# Patient Record
Sex: Female | Born: 1960 | Race: White | Hispanic: No | Marital: Married | Smoking: Former smoker
Health system: Southern US, Community
[De-identification: ages and names within clinical notes are randomized; demographics above are authoritative.]

## PROBLEM LIST (undated history)

## (undated) DIAGNOSIS — N390 Urinary tract infection, site not specified: Secondary | ICD-10-CM

## (undated) DIAGNOSIS — Z309 Encounter for contraceptive management, unspecified: Secondary | ICD-10-CM

## (undated) DIAGNOSIS — L659 Nonscarring hair loss, unspecified: Secondary | ICD-10-CM

## (undated) DIAGNOSIS — R12 Heartburn: Secondary | ICD-10-CM

## (undated) DIAGNOSIS — I10 Essential (primary) hypertension: Secondary | ICD-10-CM

## (undated) DIAGNOSIS — G5600 Carpal tunnel syndrome, unspecified upper limb: Secondary | ICD-10-CM

## (undated) DIAGNOSIS — M2550 Pain in unspecified joint: Secondary | ICD-10-CM

## (undated) DIAGNOSIS — K829 Disease of gallbladder, unspecified: Secondary | ICD-10-CM

## (undated) HISTORY — DX: Encounter for contraceptive management, unspecified: Z30.9

## (undated) HISTORY — DX: Urinary tract infection, site not specified: N39.0

## (undated) HISTORY — DX: Nonscarring hair loss, unspecified: L65.9

## (undated) HISTORY — DX: Essential (primary) hypertension: I10

## (undated) HISTORY — DX: Disease of gallbladder, unspecified: K82.9

## (undated) HISTORY — PX: CHOLECYSTECTOMY: SHX55

## (undated) HISTORY — PX: OTHER SURGICAL HISTORY: SHX169

## (undated) HISTORY — DX: Heartburn: R12

## (undated) HISTORY — DX: Pain in unspecified joint: M25.50

## (undated) HISTORY — DX: Carpal tunnel syndrome, unspecified upper limb: G56.00

---

## 1998-07-09 ENCOUNTER — Other Ambulatory Visit: Admission: RE | Admit: 1998-07-09 | Discharge: 1998-07-09 | Payer: Self-pay | Admitting: *Deleted

## 1999-09-17 ENCOUNTER — Other Ambulatory Visit: Admission: RE | Admit: 1999-09-17 | Discharge: 1999-09-17 | Payer: Self-pay | Admitting: *Deleted

## 2000-10-11 ENCOUNTER — Other Ambulatory Visit: Admission: RE | Admit: 2000-10-11 | Discharge: 2000-10-11 | Payer: Self-pay | Admitting: *Deleted

## 2001-04-04 ENCOUNTER — Ambulatory Visit (HOSPITAL_COMMUNITY): Admission: RE | Admit: 2001-04-04 | Discharge: 2001-04-04 | Payer: Self-pay | Admitting: *Deleted

## 2001-04-04 ENCOUNTER — Encounter: Payer: Self-pay | Admitting: *Deleted

## 2003-02-02 ENCOUNTER — Emergency Department (HOSPITAL_COMMUNITY): Admission: EM | Admit: 2003-02-02 | Discharge: 2003-02-03 | Payer: Self-pay | Admitting: Emergency Medicine

## 2003-02-03 ENCOUNTER — Inpatient Hospital Stay (HOSPITAL_COMMUNITY): Admission: AD | Admit: 2003-02-03 | Discharge: 2003-02-05 | Payer: Self-pay | Admitting: General Surgery

## 2003-02-03 ENCOUNTER — Encounter: Payer: Self-pay | Admitting: Emergency Medicine

## 2003-02-03 ENCOUNTER — Ambulatory Visit (HOSPITAL_COMMUNITY): Admission: RE | Admit: 2003-02-03 | Discharge: 2003-02-03 | Payer: Self-pay | Admitting: Emergency Medicine

## 2004-01-01 ENCOUNTER — Ambulatory Visit (HOSPITAL_COMMUNITY): Admission: RE | Admit: 2004-01-01 | Discharge: 2004-01-01 | Payer: Self-pay | Admitting: Obstetrics and Gynecology

## 2005-02-01 ENCOUNTER — Ambulatory Visit (HOSPITAL_COMMUNITY): Admission: RE | Admit: 2005-02-01 | Discharge: 2005-02-01 | Payer: Self-pay | Admitting: Obstetrics and Gynecology

## 2006-02-02 ENCOUNTER — Ambulatory Visit (HOSPITAL_COMMUNITY): Admission: RE | Admit: 2006-02-02 | Discharge: 2006-02-02 | Payer: Self-pay | Admitting: Obstetrics and Gynecology

## 2007-02-05 ENCOUNTER — Ambulatory Visit (HOSPITAL_COMMUNITY): Admission: RE | Admit: 2007-02-05 | Discharge: 2007-02-05 | Payer: Self-pay | Admitting: Obstetrics & Gynecology

## 2007-03-12 ENCOUNTER — Other Ambulatory Visit: Admission: RE | Admit: 2007-03-12 | Discharge: 2007-03-12 | Payer: Self-pay | Admitting: Obstetrics and Gynecology

## 2007-12-05 ENCOUNTER — Ambulatory Visit (HOSPITAL_COMMUNITY): Admission: RE | Admit: 2007-12-05 | Discharge: 2007-12-05 | Payer: Self-pay | Admitting: Family Medicine

## 2007-12-10 ENCOUNTER — Ambulatory Visit (HOSPITAL_COMMUNITY): Admission: RE | Admit: 2007-12-10 | Discharge: 2007-12-10 | Payer: Self-pay | Admitting: Family Medicine

## 2008-04-29 ENCOUNTER — Other Ambulatory Visit: Admission: RE | Admit: 2008-04-29 | Discharge: 2008-04-29 | Payer: Self-pay | Admitting: Obstetrics & Gynecology

## 2009-07-08 ENCOUNTER — Other Ambulatory Visit: Admission: RE | Admit: 2009-07-08 | Discharge: 2009-07-08 | Payer: Self-pay | Admitting: Obstetrics and Gynecology

## 2010-05-16 ENCOUNTER — Encounter: Payer: Self-pay | Admitting: Obstetrics and Gynecology

## 2010-09-10 NOTE — Group Therapy Note (Signed)
   Valerie Simon, Valerie Simon                           ACCOUNT NO.:  [id number]   MEDICAL RECORD NO.:  [id number]                   PATIENT TYPE:  INP   LOCATION:  A336                                 FACILITY:  [facility]   PHYSICIAN:  Edward L. Juanetta Gosling, M.D.             DATE OF BIRTH:  May 23, 1960   DATE OF PROCEDURE:  02/04/2003  DATE OF DISCHARGE:                                   PROGRESS NOTE   SUBJECTIVE:  Valerie Simon says she feels much better.  She did not have any pain  last night and she is set for surgery today.   OBJECTIVE:  Her exam shows her temperature is 98, pulse 71, respirations 20,  blood pressure 116/73.  She still has mild right upper quadrant tenderness.   ASSESSMENT:  She seems to be doing a lot better.   PLAN:  Continue treatments.  Await surgery.      ___________________________________________                                            Oneal Deputy Juanetta Gosling, M.D.   ELH/MEDQ  D:  02/04/2003  T:  02/04/2003  Job:  [id number]

## 2010-09-10 NOTE — Discharge Summary (Signed)
Valerie Simon, GOTO                           ACCOUNT NO.:  [id number]   MEDICAL RECORD NO.:  [id number]                   PATIENT TYPE:  INP   LOCATION:  A336                                 FACILITY:  [facility]   PHYSICIAN:  Barbaraann Barthel, M.D.              DATE OF BIRTH:  1960/11/13   DATE OF ADMISSION:  02/03/2003  DATE OF DISCHARGE:  02/05/2003                                 DISCHARGE SUMMARY   DISCHARGE DIAGNOSES:  Cholecystitis and cholelithiasis.   PROCEDURE:  On February 04, 2003, a laparoscopic cholecystectomy.   HISTORY OF PRESENT ILLNESS:  This is a 50 year old white female who was  admitted to the hospital with signs of acute cholecystitis.   HOSPITAL COURSE:  She was admitted on February 03, 2003.  We obtained a  sonogram which showed signs of acute cholecystitis as well as  cholelithiasis.  Liver function tests were grossly within normal limits.  We  admitted her and hydrated her.  We began antibiotics.  She was scheduled for  surgery after the sonogram on the following day on February 04, 2003.  At the  time of surgery she was noted to have an acutely and chronically-inflamed  gallbladder with multiple stones and a thickened gallbladder.  A  laparoscopic cholecystectomy was performed without incident.  The patient  did well.  The pathology revealed chronic active calculus cholecystitis.  The patient's postoperative course was unremarkable.  Her diet and activity  was advanced as tolerated.  Her wounds remained clean without sign of  infection or herniation.  At the time of discharge she was tolerating liquids well.  She was voiding  without dysuria, and she was afebrile.   LABORATORY DATA:  Her white count was 9.8 with an H&H of 13.4 and 39.1.  Her  metabolic-7 was grossly within normal limits.  Postoperatively her H&H was  stable at 11.2 and 33.2.  As stated, her liver function tests were within  normal limits.  The patient did very well.   DISCHARGE  INSTRUCTIONS:  1. She is excused from work.  2. She is permitted a soft diet.  3. She is permitted to shower and to walk up and down the stairs.  4. She is to avoid doing any driving or any heavy lifting or sexual activity     in the immediate postoperative period.  5. We will follow up with her perioperatively.  She is told to clean her     wound with alcohol three times a day.  6. She is instructed to take no aspirin.  7. She is told to contact us if there are any acute symptoms.   DISCHARGE MEDICATIONS:  She is given a prescription for Darvocet-N-100 one  tab q.4h. p.r.n. pain.   FOLLOWUP:  1. We will see her perioperatively.  2. She is to return to Dr. Ramon Dredge L. Juanetta Gosling, should she have any medical  problems in the future.     ___________________________________________                                         Barbaraann Barthel, M.D.   WB/MEDQ  D:  03/24/2003  T:  03/24/2003  Job:  [id number]   cc:   Ramon Dredge L. Juanetta Gosling, M.D.  [address]  [place]  [place] [address]  Fax: [phone]

## 2010-09-10 NOTE — Op Note (Signed)
NAME:  Valerie Simon, Valerie Simon NO.:  [id number]   MEDICAL RECORD NO.:  [id number]                    PATIENT TYPE:   LOCATION:                                       FACILITY:   PHYSICIAN:  Barbaraann Barthel, M.D.              DATE OF BIRTH:   DATE OF PROCEDURE:  02/04/2003  DATE OF DISCHARGE:                                 OPERATIVE REPORT   PREOPERATIVE DIAGNOSIS:  Cholecystitis and cholelithiasis.   POSTOPERATIVE DIAGNOSIS:  Cholecystitis and cholelithiasis.   PROCEDURE:  Laparoscopic cholecystectomy.   SURGEON:  Barbaraann Barthel, M.D.   SPECIMENS:  Gallbladder and stones.   NOTE:  This is a 50 year old white female who had recurrent symptoms of  right upper quadrant pain and nausea.  She was noted on sonogram to have  signs of cholecystitis with cholelithiasis present.  Liver function studies  were within normal limits.   We discussed surgery, in detail, including complications, not limited to,  but including: bleeding, infection, damage to bile ducts, perforation of  organs and transitory diarrhea.  Informed consent was obtained.   GROSS OPERATIVE FINDINGS:  Moderate inflammation, slightly thickened  gallbladder.  The cystic duct was short and of normal caliber.  A  cholangiogram was note performed.  There was an olive sized stone within the  gallbladder towards the fundus.  The right upper quadrant, otherwise,  appeared to be normal.   TECHNIQUE:  The patient was placed in the supine position and after the  adequate administration of general anesthesia by endotracheal intubation her  entire abdomen was prepped with Betadine solution and draped in the usual  manner.  With the patient in Trendelenburg, a periumbilical incision was  carried out over the superior aspect of the umbilicus.  The fascia was  grasped and elevated and a Veress needle was inserted and confirmed in  position with a saline drop test.  We then used the Visiport technique  and  an 11-mm cannula was placed in the umbilicus.  Then, under direct vision 3  other cannulas were placed; an 11-mm cannula in the epigastrium, and two 5-  mm cannulas in right upper quadrant laterally.   The gallbladder was grasped.  Its adhesions were taken down.  The cystic  duct was clearly visualized  triply silver clipped and divided as was the  cystic artery.  The gallbladder was then removed from the liver bed using  the cautery device.  There was minimal oozing.  This was controlled easily  with the cautery device.  The gallbladder was then placed in an EndoCatch  device and removed through the 11-mm cannula site in the epigastrium.  We  then checked for hemostasis, irrigated with normal saline solution and then  desufflated the abdomen.  The fascia sutures in the epigastrium and the  umbilicus incisions were closed with #0 Polysorb, and I infiltrated 1/2%  Sensorcaine, approximately 3 or 4  cc in each port site to assist with  postoperative pain.   The wounds were then irrigated and closed with a stapling device.  Prior to  closure, all sponge, needle, and instrument counts were found to be correct.  Estimated blood loss was minimal.  The patient received 1200 cc of  crystalloids intraoperatively.  No drains were placed.  There were no  complications.      ___________________________________________                                            Barbaraann Barthel, M.D.   WB/MEDQ  D:  02/04/2003  T:  02/04/2003  Job:  [id number]

## 2011-12-05 ENCOUNTER — Other Ambulatory Visit: Payer: Self-pay | Admitting: Obstetrics & Gynecology

## 2011-12-05 ENCOUNTER — Other Ambulatory Visit (HOSPITAL_COMMUNITY)
Admission: RE | Admit: 2011-12-05 | Discharge: 2011-12-05 | Disposition: A | Discharge status: Home / Self Care | Payer: 59 | Source: Ambulatory Visit | Attending: Obstetrics & Gynecology | Admitting: Obstetrics & Gynecology

## 2011-12-05 DIAGNOSIS — Z01419 Encounter for gynecological examination (general) (routine) without abnormal findings: Secondary | ICD-10-CM | POA: Insufficient documentation

## 2012-01-10 ENCOUNTER — Encounter (HOSPITAL_COMMUNITY): Payer: Self-pay | Admitting: *Deleted

## 2012-01-10 ENCOUNTER — Emergency Department (HOSPITAL_COMMUNITY)
Admission: EM | Admit: 2012-01-10 | Discharge: 2012-01-10 | Disposition: A | Discharge status: Home / Self Care | Payer: 59 | Source: Home / Self Care | Attending: Emergency Medicine | Admitting: Emergency Medicine

## 2012-01-10 DIAGNOSIS — N390 Urinary tract infection, site not specified: Secondary | ICD-10-CM

## 2012-01-10 LAB — POCT URINALYSIS DIP (DEVICE)
Bilirubin Urine: NEGATIVE
Glucose, UA: NEGATIVE mg/dL
Ketones, ur: NEGATIVE mg/dL
Nitrite: POSITIVE — AB
pH: 6 (ref 5.0–8.0)

## 2012-01-10 MED ORDER — CEPHALEXIN 500 MG PO CAPS: 500.0000 mg | ORAL_CAPSULE | Freq: Four times a day (QID) | ORAL | Status: DC

## 2012-01-10 NOTE — ED Provider Notes (Signed)
CSN: [id number]  Arrival date & time 01/10/12  1355   First MD Initiated Contact with Patient 01/10/12 1425      Chief Complaint  Patient presents with  . Urinary Tract Infection    (Consider location/radiation/quality/duration/timing/severity/associated sxs/prior treatment) HPI Comments: Patient presents urgent care complaining of pressure or increased frequency and burning with urination. Initially she was having a fall smell to her urine she thought it was related to a lipid-lowering boil that was prescribed to her by her primary care Dr. Patient denies any fevers, flank pain or vomiting or feeling nauseous. Does describe a upper back pain bilaterally. It is exacerbated with rotation of movement. Denies any spontaneous abdominal pain it only hurts when pressure is exerted or when she urinates.  Patient is a 51 y.o. female presenting with urinary tract infection. The history is provided by the patient.  Urinary Tract Infection This is a new problem. The problem occurs constantly. The problem has been gradually worsening. Pertinent negatives include no abdominal pain and no shortness of breath. Nothing aggravates the symptoms. Nothing relieves the symptoms.    History reviewed. No pertinent past medical history.  History reviewed. No pertinent past surgical history.  No family history on file.  History  Substance Use Topics  . Smoking status: Not on file  . Smokeless tobacco: Not on file  . Alcohol Use: Not on file    OB History    Grav Para Term Preterm Abortions TAB SAB Ect Mult Living                  Review of Systems  Constitutional: Negative for chills, activity change and appetite change.  Respiratory: Negative for shortness of breath.   Gastrointestinal: Negative for nausea, vomiting, abdominal pain and diarrhea.  Genitourinary: Positive for dysuria and frequency. Negative for hematuria, flank pain, enuresis, genital sores, vaginal pain, menstrual problem  and pelvic pain.  Skin: Negative for rash.    Allergies  Review of patient's allergies indicates not on file.  Home Medications   Current Outpatient Rx  Name Route Sig Dispense Refill  . LOESTRIN 1/20 (21) PO Oral Take by mouth.    . CEPHALEXIN 500 MG PO CAPS Oral Take 1 capsule (500 mg total) by mouth 4 (four) times daily. 20 capsule 0    BP 169/102  Pulse 92  Temp 98.6 F (37 C) (Oral)  Resp 19  SpO2 100%  LMP 01/03/2012  Physical Exam  Nursing note and vitals reviewed. Constitutional: Vital signs are normal. She appears well-developed and well-nourished.  Non-toxic appearance. She does not have a sickly appearance. She does not appear ill. No distress.  Eyes: Conjunctivae normal are normal.  Neck: Neck supple. No JVD present.  Cardiovascular: Normal rate.  Exam reveals no gallop and no friction rub.   No murmur heard. Pulmonary/Chest: Effort normal. No respiratory distress. She has no wheezes. She has no rales. She exhibits no tenderness.  Abdominal: She exhibits no distension. There is no hepatosplenomegaly or hepatomegaly. There is tenderness in the suprapubic area. There is no rigidity, no rebound, no guarding, no CVA tenderness, no tenderness at McBurney's point and negative Murphy's sign.  Musculoskeletal: She exhibits no tenderness.  Lymphadenopathy:    She has no cervical adenopathy.  Neurological: She is alert.  Skin: No rash noted. No erythema.    ED Course  Procedures (including critical care time)  Labs Reviewed  POCT URINALYSIS DIP (DEVICE) - Abnormal; Notable for the following:  Hgb urine dipstick SMALL (*)     Nitrite POSITIVE (*)     Leukocytes, UA MODERATE (*)  Biochemical Testing Only. Please order routine urinalysis from main lab if confirmatory testing is needed.   All other components within normal limits  POCT PREGNANCY, URINE   No results found.   1. Urinary tract infection       MDM  Uncomplicated urinary tract infection. Patient  was prescribed Keflex, we also discussed her elevated blood pressures patient will continue to monitor her blood pressure at home and they blood pressure monitor and will raise this to the attention of her primary care Dr.        Jimmie Molly, MD 01/10/12 [phone]

## 2012-01-10 NOTE — ED Notes (Signed)
Pt    Has  Symptoms  Of  painfull  Urination  Especially  At the  End  Of  Her          micturation    She  Reports  Foul  Smell  As  Well  -  She  denys  Any  Vaginal bleeding  denys  Any  Discharge          Pt  Is  Ambulatory  To  Exam  Room   Walks  With a  Steady  Fluid  Gait

## 2012-01-12 LAB — URINE CULTURE

## 2012-01-16 NOTE — ED Notes (Signed)
Urine culture positive for Escherichia Coli, pt adequately treated with Keflex per sensitivity.

## 2013-09-27 ENCOUNTER — Telehealth: Payer: Self-pay | Admitting: *Deleted

## 2013-09-27 ENCOUNTER — Other Ambulatory Visit: Payer: Self-pay | Admitting: Adult Health

## 2013-09-27 NOTE — Telephone Encounter (Signed)
Pt requesting refill Loestrin to [facility]. Pt states made annul exam in 2 weeks with Cyril Mourning, NP.

## 2013-10-07 ENCOUNTER — Other Ambulatory Visit: Payer: Self-pay | Admitting: Adult Health

## 2013-10-09 ENCOUNTER — Other Ambulatory Visit: Payer: Self-pay | Admitting: Adult Health

## 2013-10-21 ENCOUNTER — Other Ambulatory Visit: Payer: Self-pay | Admitting: Adult Health

## 2013-11-18 ENCOUNTER — Encounter: Payer: Self-pay | Admitting: Adult Health

## 2013-11-18 ENCOUNTER — Other Ambulatory Visit (HOSPITAL_COMMUNITY)
Admission: RE | Admit: 2013-11-18 | Discharge: 2013-11-18 | Disposition: A | Discharge status: Home / Self Care | Payer: 59 | Source: Ambulatory Visit | Attending: Adult Health | Admitting: Adult Health

## 2013-11-18 ENCOUNTER — Ambulatory Visit (INDEPENDENT_AMBULATORY_CARE_PROVIDER_SITE_OTHER): Payer: 59 | Admitting: Adult Health

## 2013-11-18 VITALS — BP 106/70 | HR 76 | Ht 65.0 in | Wt 245.0 lb

## 2013-11-18 DIAGNOSIS — Z3041 Encounter for surveillance of contraceptive pills: Secondary | ICD-10-CM

## 2013-11-18 DIAGNOSIS — Z1151 Encounter for screening for human papillomavirus (HPV): Secondary | ICD-10-CM | POA: Insufficient documentation

## 2013-11-18 DIAGNOSIS — Z01419 Encounter for gynecological examination (general) (routine) without abnormal findings: Secondary | ICD-10-CM | POA: Insufficient documentation

## 2013-11-18 DIAGNOSIS — Z309 Encounter for contraceptive management, unspecified: Secondary | ICD-10-CM

## 2013-11-18 DIAGNOSIS — Z139 Encounter for screening, unspecified: Secondary | ICD-10-CM

## 2013-11-18 DIAGNOSIS — Z1212 Encounter for screening for malignant neoplasm of rectum: Secondary | ICD-10-CM

## 2013-11-18 HISTORY — DX: Encounter for contraceptive management, unspecified: Z30.9

## 2013-11-18 LAB — HEMOCCULT GUIAC POC 1CARD (OFFICE): Fecal Occult Blood, POC: NEGATIVE

## 2013-11-18 MED ORDER — NORETHIN-ETH ESTRAD-FE BIPHAS 1 MG-10 MCG / 10 MCG PO TABS: ORAL_TABLET | ORAL | Status: DC

## 2013-11-18 NOTE — Patient Instructions (Signed)
Physical in 1 year Mammogram now and yearly Referred for colonoscopy

## 2013-11-18 NOTE — Progress Notes (Signed)
Patient ID: Valerie Simon, female   DOB: 02/22/1961, 53 y.o.   MRN: [id number] History of Present Illness: Valerie Simon is a 53 year old white female in for pap and physical.No complaints.   Current Medications, Allergies, Past Medical History, Past Surgical History, Family History and Social History were reviewed in [facility] electronic medical record.     Review of Systems: Patient denies any headaches, blurred vision, shortness of breath, chest pain, abdominal pain, problems with bowel movements, urination, or intercourse. No joint pain or mood swings.Periods light on OCs.    Physical Exam:BP 106/70  Pulse 76  Ht 5\' 5"  (1.651 m)  Wt 245 lb (111.131 kg)  BMI 40.77 kg/m2  LMP 10/21/2013 General:  Well developed, well nourished, no acute distress Skin:  Warm and dry Neck:  Midline trachea, normal thyroid Lungs; Clear to auscultation bilaterally Breast:  No dominant palpable mass, retraction, or nipple discharge Cardiovascular: Regular rate and rhythm Abdomen:  Soft, non tender, no hepatosplenomegaly Pelvic:  External genitalia is normal in appearance.  The vagina is normal in appearance.  The cervix is bulbous. Pap with HPV performed. Uterus is felt to be normal size, shape, and contour.  No                adnexal masses or tenderness noted. Rectal: Good sphincter tone, no polyps, or hemorrhoids felt.  Hemoccult negative. Extremities:  No swelling or varicosities noted Psych:  No mood changes, alert and cooperative, seems happy   Impression: Yearly gyn exam Contraceptive management    Plan: Physical in 1 year Mammogram NOW and yearly last one 2008 Referred to Dr Karilyn Cotaehman for colonoscopy Labs with PCP Refilled lo loestrin Disp 1 pack, take 1 daily with 11 refills, will stay on them til 54, then stop and stay off x 1 month [facility]

## 2013-11-21 ENCOUNTER — Encounter (INDEPENDENT_AMBULATORY_CARE_PROVIDER_SITE_OTHER): Payer: Self-pay | Admitting: *Deleted

## 2013-11-21 LAB — CYTOLOGY - PAP

## 2014-02-14 ENCOUNTER — Telehealth: Payer: Self-pay | Admitting: Adult Health

## 2014-02-14 MED ORDER — NORETHIN-ETH ESTRAD-FE BIPHAS 1 MG-10 MCG / 10 MCG PO TABS: 1.0000 | ORAL_TABLET | Freq: Every day | ORAL | Status: DC

## 2014-02-14 NOTE — Addendum Note (Signed)
Addended by: Criss AlvinePULLIAM, Clarinda Obi G on: 02/14/2014 11:50 AM   Modules accepted: Orders

## 2014-02-14 NOTE — Telephone Encounter (Addendum)
Pt informed samples for Lo loestrin at front desk for pick up, to take until age 53, off OCP 1 month check FSH per Cyril MourningJennifer Griffin, NP

## 2014-02-14 NOTE — Telephone Encounter (Signed)
Pt requesting samples of Lo Loestrin, states lost her job, has no insurance. Pt also states when she saw Cyril MourningJennifer Griffin, NP in July for annual she stated due to pt age would not stay on the birth control long. Please advise.

## 2014-02-14 NOTE — Telephone Encounter (Signed)
Told Chrystal can have samples and called pt will stay on OCs til 54 then check FSH after off of them for 1 month.

## 2014-02-24 ENCOUNTER — Encounter: Payer: Self-pay | Admitting: Adult Health

## 2014-04-10 ENCOUNTER — Telehealth: Payer: Self-pay | Admitting: Adult Health

## 2014-04-10 NOTE — Telephone Encounter (Signed)
Spoke with pt. Pt is requesting samples of Lo Loestrin. I spoke with JAG and she advised can give 2 boxes of samples. Lot # [id number] A exp 4/16. Pt to pick up at front desk. JSY

## 2014-06-04 ENCOUNTER — Telehealth: Payer: Self-pay | Admitting: *Deleted

## 2014-06-04 MED ORDER — NORETHIN ACE-ETH ESTRAD-FE 1-20 MG-MCG(24) PO TABS: 1.0000 | ORAL_TABLET | Freq: Every day | ORAL | Status: DC

## 2014-06-04 NOTE — Telephone Encounter (Signed)
3 sample boxes of LoLoestrin given. Lot # [id number], exp 4/16. Pt aware to pick up. JSY

## 2014-08-25 ENCOUNTER — Telehealth: Payer: Self-pay | Admitting: Adult Health

## 2014-08-25 NOTE — Telephone Encounter (Signed)
Spoke with pt letting her know she can pick up 2 sample boxes of Lo Loestrin per JAG. Lot # [id number] A exp 1/17. Samples at front desk. JSY

## 2014-10-17 ENCOUNTER — Other Ambulatory Visit (HOSPITAL_COMMUNITY): Payer: Self-pay | Admitting: Respiratory Therapy

## 2014-10-17 DIAGNOSIS — G4710 Hypersomnia, unspecified: Secondary | ICD-10-CM

## 2014-10-17 DIAGNOSIS — G4730 Sleep apnea, unspecified: Principal | ICD-10-CM

## 2014-11-03 ENCOUNTER — Other Ambulatory Visit (HOSPITAL_COMMUNITY): Payer: Self-pay | Admitting: Respiratory Therapy

## 2014-11-21 ENCOUNTER — Other Ambulatory Visit (HOSPITAL_COMMUNITY): Payer: Self-pay | Admitting: Respiratory Therapy

## 2014-11-24 ENCOUNTER — Telehealth: Payer: Self-pay | Admitting: Adult Health

## 2014-11-26 ENCOUNTER — Encounter: Payer: Self-pay | Admitting: Adult Health

## 2014-11-26 ENCOUNTER — Ambulatory Visit (INDEPENDENT_AMBULATORY_CARE_PROVIDER_SITE_OTHER): Payer: 59 | Admitting: Adult Health

## 2014-11-26 VITALS — BP 118/72 | HR 92 | Ht 65.0 in | Wt 249.0 lb

## 2014-11-26 DIAGNOSIS — Z01419 Encounter for gynecological examination (general) (routine) without abnormal findings: Secondary | ICD-10-CM | POA: Diagnosis not present

## 2014-11-26 DIAGNOSIS — Z1212 Encounter for screening for malignant neoplasm of rectum: Secondary | ICD-10-CM

## 2014-11-26 DIAGNOSIS — Z3041 Encounter for surveillance of contraceptive pills: Secondary | ICD-10-CM

## 2014-11-26 LAB — HEMOCCULT GUIAC POC 1CARD (OFFICE): FECAL OCCULT BLD: NEGATIVE

## 2014-11-26 MED ORDER — NORETHIN-ETH ESTRAD-FE BIPHAS 1 MG-10 MCG / 10 MCG PO TABS: 1.0000 | ORAL_TABLET | Freq: Every day | ORAL | Status: DC

## 2014-11-26 NOTE — Telephone Encounter (Signed)
1 box of Lo Loestrin samples given. Pt informed due to change in birth control to use condoms for back up per Cyril Mourning, NP. Pt verbalized understanding.

## 2014-11-26 NOTE — Patient Instructions (Signed)
Get mammogram now [phone] Call when finishes OCs in October to check [facility] in November Physical in 1 year Colonoscopy advised

## 2014-11-26 NOTE — Progress Notes (Signed)
Patient ID: Valerie Simon, female   DOB: 20-Jun-1960, 54 y.o.   MRN: [id number] History of Present Illness: Valerie Simon is a 54 year old white female, married in for well woman gyn exam.She had a normal pap with negative HPV 11/18/13. PCP is Dr Hughie Closs.  Current Medications, Allergies, Past Medical History, Past Surgical History, Family History and Social History were reviewed in [facility] record.     Review of Systems: Patient denies any headaches, hearing loss, fatigue, blurred vision, shortness of breath, chest pain, abdominal pain, problems with bowel movements, urination, or intercourse. No joint pain or mood swings.She is still on lo loestrin and does not have periods but may spot if misses pill.    Physical Exam:BP 118/72 mmHg  Pulse 92  Ht  (1.651 m)  Wt 249 lb (112.946 kg)  BMI 41.44 kg/m2 General:  Well developed, well nourished, no acute distress Skin:  Warm and dry Neck:  Midline trachea, normal thyroid, good ROM, no lymphadenopathy Lungs; Clear to auscultation bilaterally Breast:  No dominant palpable mass, retraction, or nipple discharge Cardiovascular: Regular rate and rhythm Abdomen:  Soft, non tender, no hepatosplenomegaly Pelvic:  External genitalia is normal in appearance,has 2 tiny skin tags in right crease of leg,in pantie line.  The vagina is normal in appearance. Urethra has no lesions or masses. The cervix is bulbous.  Uterus is felt to be normal size, shape, and contour.  No adnexal masses or tenderness noted.Bladder is non tender, no masses felt. Rectal: Good sphincter tone, no polyps, or hemorrhoids felt.  Hemoccult negative. Extremities/musculoskeletal:  No swelling or varicosities noted, no clubbing or cyanosis Psych:  No mood changes, alert and cooperative,seems happy   Impression: Well woman gyn exam no pap Contraceptive management    Plan: Get mammogram now and yearly, her last one was 2008(just got insurance) Physical in 1  year Given 2 packs lo loestrin lot [id number] A exp 5/17 Call when finishes OCs in October , then be off OCs x 1 month and check FSH, use condoms then Colonoscopy advised Labs with PCP

## 2014-12-07 ENCOUNTER — Ambulatory Visit: Payer: 59 | Attending: Dermatology | Admitting: Sleep Medicine

## 2014-12-07 DIAGNOSIS — G4730 Sleep apnea, unspecified: Secondary | ICD-10-CM

## 2014-12-07 DIAGNOSIS — G4733 Obstructive sleep apnea (adult) (pediatric): Secondary | ICD-10-CM | POA: Insufficient documentation

## 2014-12-07 DIAGNOSIS — G4710 Hypersomnia, unspecified: Secondary | ICD-10-CM

## 2014-12-10 ENCOUNTER — Other Ambulatory Visit (HOSPITAL_COMMUNITY): Payer: Self-pay | Admitting: Respiratory Therapy

## 2014-12-20 NOTE — Sleep Study (Signed)
  [facility] Edder Bellanca A. Gerilyn Pilgrim, MD     [email or web address]             NOCTURNAL POLYSOMNOGRAPHY   LOCATION: [facility]   Patient Name: Valerie Simon, Valerie Simon Date: 12/07/2014 Gender: Female D.O.B: [birth year] Age (years): 53 Referring Provider: Catalina Pizza Height (inches): 65 Interpreting Physician: Beryle Beams MD, ABSM Weight (lbs): 249 RPSGT: Alfonso Ellis BMI: 41 MRN: [id number] Neck Size: 16.50 CLINICAL INFORMATION Sleep Study Type: NPSG     Indication for sleep study: N/A     Epworth Sleepiness Score: 11     SLEEP STUDY TECHNIQUE As per the AASM Manual for the Scoring of Sleep and Associated Events v2.3 (April 2016) with a hypopnea requiring 4% desaturations.  The channels recorded and monitored were frontal, central and occipital EEG, electrooculogram (EOG), submentalis EMG (chin), nasal and oral airflow, thoracic and abdominal wall motion, anterior tibialis EMG, snore microphone, electrocardiogram, and pulse oximetry.  MEDICATIONS Patient's medications include: N/A. Medications self-administered by patient during sleep study : No sleep medicine administered.  SLEEP ARCHITECTURE The study was initiated at 10:22:00 PM and ended at 4:52:55 AM.  Sleep onset time was 82.8 minutes and the sleep efficiency was 64.0%. The total sleep time was 250.0 minutes.  Stage REM latency was 123.0 minutes.  The patient spent 2.80% of the night in stage N1 sleep, 60.20% in stage N2 sleep, 21.40% in stage N3 and 15.60% in REM.  Alpha intrusion was absent.  Supine sleep was 38.44%.  RESPIRATORY PARAMETERS The overall apnea/hypopnea index (AHI) was 5.8 per hour. There were 4 total apneas, including 4 obstructive, 0 central and 0 mixed apneas. There were 20 hypopneas and 0 RERAs.  The AHI during Stage REM sleep was 21.5 per hour.  AHI while supine was 10.6 per hour.  The mean oxygen saturation was 96.21%. The minimum SpO2 during sleep was 92.00%.  Moderate  snoring was noted during this study.  CARDIAC DATA The 2 lead EKG demonstrated sinus rhythm. The mean heart rate was 70.94 beats per minute. Other EKG findings include: None. LEG MOVEMENT DATA The total PLMS were 104 with a resulting PLMS index of 24.96. Associated arousal with leg movement index was 1.4 .  IMPRESSIONS Mild obstructive sleep apnea. No significant central sleep apnea occurred during this study. The patient had minimal or no oxygen desaturation. The patient snored with Moderate snoring volume. No cardiac abnormalities were noted during this study. Mild periodic limb movements of sleep occurred during the study. No significant associated arousals.    Argie Ramming, MD Diplomate, American Board of Sleep Medicine.

## 2014-12-22 ENCOUNTER — Other Ambulatory Visit (HOSPITAL_COMMUNITY): Payer: Self-pay | Admitting: Respiratory Therapy

## 2014-12-24 ENCOUNTER — Other Ambulatory Visit: Payer: Self-pay | Admitting: Adult Health

## 2014-12-24 DIAGNOSIS — Z1231 Encounter for screening mammogram for malignant neoplasm of breast: Secondary | ICD-10-CM

## 2015-01-01 ENCOUNTER — Ambulatory Visit (HOSPITAL_COMMUNITY)
Admission: RE | Admit: 2015-01-01 | Discharge: 2015-01-01 | Disposition: A | Discharge status: Home / Self Care | Payer: 59 | Source: Ambulatory Visit | Attending: Adult Health | Admitting: Adult Health

## 2015-01-01 DIAGNOSIS — Z1231 Encounter for screening mammogram for malignant neoplasm of breast: Secondary | ICD-10-CM | POA: Diagnosis not present

## 2015-01-06 ENCOUNTER — Other Ambulatory Visit: Payer: Self-pay | Admitting: Adult Health

## 2015-01-06 DIAGNOSIS — R928 Other abnormal and inconclusive findings on diagnostic imaging of breast: Secondary | ICD-10-CM

## 2015-01-13 ENCOUNTER — Ambulatory Visit (HOSPITAL_COMMUNITY)
Admission: RE | Admit: 2015-01-13 | Discharge: 2015-01-13 | Disposition: A | Discharge status: Home / Self Care | Payer: 59 | Source: Ambulatory Visit | Attending: Adult Health | Admitting: Adult Health

## 2015-01-13 DIAGNOSIS — R928 Other abnormal and inconclusive findings on diagnostic imaging of breast: Secondary | ICD-10-CM | POA: Insufficient documentation

## 2015-02-16 ENCOUNTER — Telehealth: Payer: Self-pay | Admitting: *Deleted

## 2015-02-16 DIAGNOSIS — Z780 Asymptomatic menopausal state: Secondary | ICD-10-CM

## 2015-02-16 NOTE — Telephone Encounter (Signed)
Left message to use condoms [facility]

## 2015-03-17 ENCOUNTER — Telehealth: Payer: Self-pay | Admitting: Adult Health

## 2015-03-17 LAB — FOLLICLE STIMULATING HORMONE: FSH: 63.9 m[IU]/mL

## 2015-03-17 NOTE — Telephone Encounter (Signed)
Left message about labs,call me

## 2015-03-17 NOTE — Telephone Encounter (Signed)
Pt aware of FSH,63.9 and is PM,had period after stopping OCs, and to continue with condoms for now,let me know if has any bleeding

## 2016-02-09 ENCOUNTER — Other Ambulatory Visit (HOSPITAL_COMMUNITY)
Admission: AD | Admit: 2016-02-09 | Discharge: 2016-02-09 | Disposition: A | Discharge status: Home / Self Care | Payer: BLUE CROSS/BLUE SHIELD | Source: Skilled Nursing Facility | Attending: Urology | Admitting: Urology

## 2016-02-09 ENCOUNTER — Ambulatory Visit (INDEPENDENT_AMBULATORY_CARE_PROVIDER_SITE_OTHER): Payer: BLUE CROSS/BLUE SHIELD | Admitting: Urology

## 2016-02-09 DIAGNOSIS — N3000 Acute cystitis without hematuria: Secondary | ICD-10-CM

## 2016-02-09 LAB — URINALYSIS, ROUTINE W REFLEX MICROSCOPIC
Bilirubin Urine: NEGATIVE
Glucose, UA: NEGATIVE mg/dL
Hgb urine dipstick: NEGATIVE
Ketones, ur: NEGATIVE mg/dL
Leukocytes, UA: NEGATIVE
Nitrite: NEGATIVE
Protein, ur: NEGATIVE mg/dL
Specific Gravity, Urine: <1.005 — ABNORMAL LOW (ref 1.005–1.030)
pH: 6 (ref 5.0–8.0)

## 2016-02-12 ENCOUNTER — Other Ambulatory Visit: Payer: Self-pay | Admitting: Urology

## 2016-02-12 DIAGNOSIS — N3001 Acute cystitis with hematuria: Secondary | ICD-10-CM

## 2016-02-17 ENCOUNTER — Ambulatory Visit (HOSPITAL_COMMUNITY): Admission: RE | Admit: 2016-02-17 | Payer: BLUE CROSS/BLUE SHIELD | Source: Ambulatory Visit

## 2016-03-25 ENCOUNTER — Other Ambulatory Visit: Payer: Self-pay | Admitting: Urology

## 2016-03-25 DIAGNOSIS — R319 Hematuria, unspecified: Secondary | ICD-10-CM

## 2016-03-30 ENCOUNTER — Ambulatory Visit (HOSPITAL_COMMUNITY): Payer: BLUE CROSS/BLUE SHIELD

## 2016-12-19 ENCOUNTER — Encounter (INDEPENDENT_AMBULATORY_CARE_PROVIDER_SITE_OTHER): Payer: Self-pay | Admitting: *Deleted

## 2017-03-06 DIAGNOSIS — N390 Urinary tract infection, site not specified: Secondary | ICD-10-CM | POA: Diagnosis not present

## 2017-03-06 DIAGNOSIS — I10 Essential (primary) hypertension: Secondary | ICD-10-CM | POA: Diagnosis not present

## 2017-11-14 ENCOUNTER — Ambulatory Visit: Payer: Self-pay | Admitting: Adult Health

## 2017-11-29 ENCOUNTER — Telehealth: Payer: Self-pay | Admitting: Adult Health

## 2017-11-29 NOTE — Telephone Encounter (Signed)
Left message @ 10:04 am. JSY

## 2017-11-29 NOTE — Telephone Encounter (Signed)
Spoke with pt. Pt has a friend that was recently diagnosed with cervical cancer. Her friend is not a patient here. I advised I couldn't give any advise but if she wanted to call and schedule an appt here, that would be fine. Pt voiced understanding and she states she will talk to her friend and see if she will schedule an appt. JSY

## 2017-12-11 ENCOUNTER — Other Ambulatory Visit: Payer: Self-pay | Admitting: Internal Medicine

## 2017-12-11 DIAGNOSIS — I10 Essential (primary) hypertension: Secondary | ICD-10-CM | POA: Diagnosis not present

## 2017-12-11 DIAGNOSIS — E6609 Other obesity due to excess calories: Secondary | ICD-10-CM | POA: Diagnosis not present

## 2017-12-11 DIAGNOSIS — Z6835 Body mass index (BMI) 35.0-35.9, adult: Secondary | ICD-10-CM | POA: Diagnosis not present

## 2017-12-11 DIAGNOSIS — Z0000 Encounter for general adult medical examination without abnormal findings: Secondary | ICD-10-CM | POA: Diagnosis not present

## 2017-12-11 DIAGNOSIS — Z1231 Encounter for screening mammogram for malignant neoplasm of breast: Secondary | ICD-10-CM

## 2017-12-12 ENCOUNTER — Encounter (INDEPENDENT_AMBULATORY_CARE_PROVIDER_SITE_OTHER): Payer: Self-pay | Admitting: *Deleted

## 2017-12-12 DIAGNOSIS — I10 Essential (primary) hypertension: Secondary | ICD-10-CM | POA: Diagnosis not present

## 2018-01-01 ENCOUNTER — Ambulatory Visit (HOSPITAL_COMMUNITY)
Admission: RE | Admit: 2018-01-01 | Discharge: 2018-01-01 | Disposition: A | Discharge status: Home / Self Care | Payer: BLUE CROSS/BLUE SHIELD | Source: Ambulatory Visit | Attending: Internal Medicine | Admitting: Internal Medicine

## 2018-01-01 DIAGNOSIS — Z1231 Encounter for screening mammogram for malignant neoplasm of breast: Secondary | ICD-10-CM | POA: Insufficient documentation

## 2018-01-03 ENCOUNTER — Ambulatory Visit (HOSPITAL_COMMUNITY): Payer: BLUE CROSS/BLUE SHIELD

## 2018-02-20 ENCOUNTER — Other Ambulatory Visit: Payer: Self-pay | Admitting: Adult Health

## 2018-02-20 ENCOUNTER — Encounter (INDEPENDENT_AMBULATORY_CARE_PROVIDER_SITE_OTHER): Payer: Self-pay

## 2018-02-20 ENCOUNTER — Ambulatory Visit (INDEPENDENT_AMBULATORY_CARE_PROVIDER_SITE_OTHER): Payer: BLUE CROSS/BLUE SHIELD | Admitting: Adult Health

## 2018-02-20 ENCOUNTER — Encounter: Payer: Self-pay | Admitting: Adult Health

## 2018-02-20 ENCOUNTER — Encounter: Payer: Self-pay | Admitting: Obstetrics & Gynecology

## 2018-02-20 ENCOUNTER — Other Ambulatory Visit: Payer: Self-pay

## 2018-02-20 VITALS — BP 143/91 | HR 98 | Ht 65.0 in | Wt 192.0 lb

## 2018-02-20 DIAGNOSIS — B379 Candidiasis, unspecified: Secondary | ICD-10-CM

## 2018-02-20 DIAGNOSIS — N898 Other specified noninflammatory disorders of vagina: Secondary | ICD-10-CM | POA: Diagnosis not present

## 2018-02-20 DIAGNOSIS — R300 Dysuria: Secondary | ICD-10-CM | POA: Diagnosis not present

## 2018-02-20 DIAGNOSIS — R319 Hematuria, unspecified: Secondary | ICD-10-CM

## 2018-02-20 DIAGNOSIS — Z113 Encounter for screening for infections with a predominantly sexual mode of transmission: Secondary | ICD-10-CM

## 2018-02-20 LAB — POCT URINALYSIS DIPSTICK OB
GLUCOSE, UA: NEGATIVE
Ketones, UA: NEGATIVE
NITRITE UA: POSITIVE

## 2018-02-20 LAB — POCT WET PREP (WET MOUNT): WBC, Wet Prep HPF POC: POSITIVE

## 2018-02-20 MED ORDER — FLUCONAZOLE 150 MG PO TABS: ORAL_TABLET | ORAL | 1 refills | Status: DC

## 2018-02-20 MED ORDER — SULFAMETHOXAZOLE-TRIMETHOPRIM 800-160 MG PO TABS: 1.0000 | ORAL_TABLET | Freq: Two times a day (BID) | ORAL | 0 refills | Status: DC

## 2018-02-20 NOTE — Progress Notes (Addendum)
  Subjective:     Patient ID: Valerie Simon, female   DOB: 08/20/1960, 57 y.o.   MRN: [id number]  HPI Valerie Simon is a 57 year old white female, married in complaining of burning with urination, vaginal itching and discharge.She and husband had a rough time but doing much better now, having sex almost daily.He does not get as hard. She has had physical and pap that was normal with PCP.  PCP is Dr Margo Aye.  Review of Systems +burning with urination +Vaginal itching  +vaginal discharge Having sex almost daily  Reviewed past medical,surgical, social and family history. Reviewed medications and allergies.     Objective:   Physical Exam BP (!) 143/91 (BP Location: Right Arm, Patient Position: Sitting, Cuff Size: Normal)   Pulse 98   Ht 5\' 5"  (1.651 m)   Wt 192 lb (87.1 kg)   LMP 10/21/2013   BMI 31.95 kg/m urine dipstick:+leuks, +ketones, +protein and blood. Skin warm and dry.Pelvic: external genitalia is normal in appearance,slightly red in folds of labia, has linear cut on left, vagina: white watery discharge without odor,urethra has no lesions or masses noted, cervix:smooth and bulbous, uterus: normal size, shape and contour, non tender, no masses felt, adnexa: no masses or tenderness noted. Bladder is non tender and no masses felt. Wet prep: + for yeast,++WBCs. GC/CHL obtained.  Examination chaperoned by Francene Finders RN. Has lost over 60 lbs in last 3 years or so.     Assessment:     1. Burning with urination   2. Hematuria, unspecified type   3. Vaginal discharge   4. Vaginal itching   5. Yeast infection   6. Screening examination for STD (sexually transmitted disease)       Plan:     No sex during treatment Meds ordered this encounter  Medications  . fluconazole (DIFLUCAN) 150 MG tablet    Sig: Take 1 now and 1 in 3 days    Dispense:  2 tablet    Refill:  1    Order Specific Question:   Supervising Provider    Answer:   EURE, LUTHER H [[id number]]  .  sulfamethoxazole-trimethoprim (BACTRIM DS,SEPTRA DS) 800-160 MG tablet    Sig: Take 1 tablet by mouth 2 (two) times daily. Take 1 bid    Dispense:  14 tablet    Refill:  0    Order Specific Question:   Supervising Provider    Answer:   Duane Lope H [[id number]]  UA C&S sent GC/CHL sent Use astroglide with sex, and increase foreplay F/U prn

## 2018-02-21 LAB — MICROSCOPIC EXAMINATION: Casts: NONE SEEN /lpf

## 2018-02-21 LAB — URINALYSIS, ROUTINE W REFLEX MICROSCOPIC
BILIRUBIN UA: NEGATIVE
Glucose, UA: NEGATIVE
NITRITE UA: POSITIVE — AB
PH UA: 5.5 (ref 5.0–7.5)
SPEC GRAV UA: 1.025 (ref 1.005–1.030)
UUROB: 0.2 mg/dL (ref 0.2–1.0)

## 2018-02-22 ENCOUNTER — Telehealth: Payer: Self-pay | Admitting: Adult Health

## 2018-02-22 DIAGNOSIS — M545 Low back pain: Secondary | ICD-10-CM | POA: Diagnosis not present

## 2018-02-22 DIAGNOSIS — S39012A Strain of muscle, fascia and tendon of lower back, initial encounter: Secondary | ICD-10-CM | POA: Diagnosis not present

## 2018-02-22 DIAGNOSIS — M62830 Muscle spasm of back: Secondary | ICD-10-CM | POA: Diagnosis not present

## 2018-02-22 DIAGNOSIS — I10 Essential (primary) hypertension: Secondary | ICD-10-CM | POA: Diagnosis not present

## 2018-02-22 LAB — GC/CHLAMYDIA PROBE AMP
CHLAMYDIA, DNA PROBE: NEGATIVE
NEISSERIA GONORRHOEAE BY PCR: NEGATIVE

## 2018-02-22 NOTE — Telephone Encounter (Signed)
Pt aware that GC/CHL both negative  

## 2018-02-24 LAB — URINE CULTURE

## 2018-02-26 ENCOUNTER — Telehealth: Payer: Self-pay | Admitting: Adult Health

## 2018-02-26 MED ORDER — NITROFURANTOIN MONOHYD MACRO 100 MG PO CAPS: 100.0000 mg | ORAL_CAPSULE | Freq: Two times a day (BID) | ORAL | 0 refills | Status: DC

## 2018-02-26 NOTE — Telephone Encounter (Signed)
Left message that urine culture + e coli, and not sensitive  to septra ds will rx macrobid 1 bid x 7 days

## 2018-02-26 NOTE — Telephone Encounter (Signed)
Patient called, she's confused because she just received a call that she has another medication to pick up.  She wants to know if she needs to discontinue the previously prescribed medication and start the new one or take both.  She would just like some clarification.  Valerie Simon  [phone] - she's at work, we will need to leave her a message

## 2018-02-26 NOTE — Telephone Encounter (Signed)
Stop septra ds and start macrobid

## 2018-05-18 DIAGNOSIS — M545 Low back pain: Secondary | ICD-10-CM | POA: Diagnosis not present

## 2018-05-18 DIAGNOSIS — N390 Urinary tract infection, site not specified: Secondary | ICD-10-CM | POA: Diagnosis not present

## 2018-05-18 DIAGNOSIS — R10823 Right lower quadrant rebound abdominal tenderness: Secondary | ICD-10-CM | POA: Diagnosis not present

## 2018-05-18 DIAGNOSIS — R300 Dysuria: Secondary | ICD-10-CM | POA: Diagnosis not present

## 2018-05-18 DIAGNOSIS — R109 Unspecified abdominal pain: Secondary | ICD-10-CM | POA: Diagnosis not present

## 2018-06-07 DIAGNOSIS — R300 Dysuria: Secondary | ICD-10-CM | POA: Diagnosis not present

## 2018-06-15 DIAGNOSIS — E6609 Other obesity due to excess calories: Secondary | ICD-10-CM | POA: Diagnosis not present

## 2018-06-15 DIAGNOSIS — M62830 Muscle spasm of back: Secondary | ICD-10-CM | POA: Diagnosis not present

## 2018-06-15 DIAGNOSIS — R300 Dysuria: Secondary | ICD-10-CM | POA: Diagnosis not present

## 2018-06-15 DIAGNOSIS — I10 Essential (primary) hypertension: Secondary | ICD-10-CM | POA: Diagnosis not present

## 2018-06-15 DIAGNOSIS — Z0000 Encounter for general adult medical examination without abnormal findings: Secondary | ICD-10-CM | POA: Diagnosis not present

## 2018-06-15 DIAGNOSIS — Z6835 Body mass index (BMI) 35.0-35.9, adult: Secondary | ICD-10-CM | POA: Diagnosis not present

## 2018-06-15 DIAGNOSIS — M545 Low back pain: Secondary | ICD-10-CM | POA: Diagnosis not present

## 2018-12-14 DIAGNOSIS — E6609 Other obesity due to excess calories: Secondary | ICD-10-CM | POA: Diagnosis not present

## 2018-12-14 DIAGNOSIS — Z6835 Body mass index (BMI) 35.0-35.9, adult: Secondary | ICD-10-CM | POA: Diagnosis not present

## 2018-12-14 DIAGNOSIS — I10 Essential (primary) hypertension: Secondary | ICD-10-CM | POA: Diagnosis not present

## 2018-12-20 DIAGNOSIS — E6609 Other obesity due to excess calories: Secondary | ICD-10-CM | POA: Diagnosis not present

## 2018-12-20 DIAGNOSIS — Z0001 Encounter for general adult medical examination with abnormal findings: Secondary | ICD-10-CM | POA: Diagnosis not present

## 2018-12-20 DIAGNOSIS — E870 Hyperosmolality and hypernatremia: Secondary | ICD-10-CM | POA: Diagnosis not present

## 2018-12-20 DIAGNOSIS — E7800 Pure hypercholesterolemia, unspecified: Secondary | ICD-10-CM | POA: Diagnosis not present

## 2018-12-21 ENCOUNTER — Other Ambulatory Visit (HOSPITAL_COMMUNITY): Payer: Self-pay | Admitting: Internal Medicine

## 2018-12-21 DIAGNOSIS — Z1231 Encounter for screening mammogram for malignant neoplasm of breast: Secondary | ICD-10-CM

## 2018-12-25 ENCOUNTER — Ambulatory Visit: Payer: BLUE CROSS/BLUE SHIELD | Admitting: Urology

## 2019-02-04 ENCOUNTER — Other Ambulatory Visit: Payer: Self-pay

## 2019-02-04 ENCOUNTER — Ambulatory Visit (HOSPITAL_COMMUNITY)
Admission: RE | Admit: 2019-02-04 | Discharge: 2019-02-04 | Disposition: A | Discharge status: Home / Self Care | Payer: BC Managed Care – PPO | Source: Ambulatory Visit | Attending: Internal Medicine | Admitting: Internal Medicine

## 2019-02-04 DIAGNOSIS — Z1231 Encounter for screening mammogram for malignant neoplasm of breast: Secondary | ICD-10-CM | POA: Diagnosis not present

## 2019-02-05 ENCOUNTER — Other Ambulatory Visit: Payer: Self-pay

## 2019-02-05 DIAGNOSIS — Z20822 Contact with and (suspected) exposure to covid-19: Secondary | ICD-10-CM

## 2019-02-05 DIAGNOSIS — Z20828 Contact with and (suspected) exposure to other viral communicable diseases: Secondary | ICD-10-CM | POA: Diagnosis not present

## 2019-02-07 LAB — NOVEL CORONAVIRUS, NAA: SARS-CoV-2, NAA: NOT DETECTED

## 2019-03-13 DIAGNOSIS — N390 Urinary tract infection, site not specified: Secondary | ICD-10-CM | POA: Diagnosis not present

## 2019-03-13 DIAGNOSIS — R159 Full incontinence of feces: Secondary | ICD-10-CM | POA: Diagnosis not present

## 2019-04-25 DIAGNOSIS — Z0000 Encounter for general adult medical examination without abnormal findings: Secondary | ICD-10-CM | POA: Diagnosis not present

## 2019-04-25 DIAGNOSIS — I10 Essential (primary) hypertension: Secondary | ICD-10-CM | POA: Diagnosis not present

## 2019-04-25 DIAGNOSIS — E6609 Other obesity due to excess calories: Secondary | ICD-10-CM | POA: Diagnosis not present

## 2019-04-25 DIAGNOSIS — Z6835 Body mass index (BMI) 35.0-35.9, adult: Secondary | ICD-10-CM | POA: Diagnosis not present

## 2019-04-25 DIAGNOSIS — R3915 Urgency of urination: Secondary | ICD-10-CM | POA: Diagnosis not present

## 2019-04-25 DIAGNOSIS — N390 Urinary tract infection, site not specified: Secondary | ICD-10-CM | POA: Diagnosis not present

## 2019-05-31 ENCOUNTER — Other Ambulatory Visit: Payer: BC Managed Care – PPO | Admitting: Adult Health

## 2019-06-10 DIAGNOSIS — M79671 Pain in right foot: Secondary | ICD-10-CM | POA: Diagnosis not present

## 2019-06-10 DIAGNOSIS — N390 Urinary tract infection, site not specified: Secondary | ICD-10-CM | POA: Diagnosis not present

## 2019-08-17 DIAGNOSIS — N390 Urinary tract infection, site not specified: Secondary | ICD-10-CM | POA: Diagnosis not present

## 2019-11-01 DIAGNOSIS — J069 Acute upper respiratory infection, unspecified: Secondary | ICD-10-CM | POA: Diagnosis not present

## 2019-11-04 DIAGNOSIS — Z0000 Encounter for general adult medical examination without abnormal findings: Secondary | ICD-10-CM | POA: Diagnosis not present

## 2019-11-04 DIAGNOSIS — E6609 Other obesity due to excess calories: Secondary | ICD-10-CM | POA: Diagnosis not present

## 2019-11-04 DIAGNOSIS — Z6835 Body mass index (BMI) 35.0-35.9, adult: Secondary | ICD-10-CM | POA: Diagnosis not present

## 2019-11-04 DIAGNOSIS — I10 Essential (primary) hypertension: Secondary | ICD-10-CM | POA: Diagnosis not present

## 2019-12-26 DIAGNOSIS — I10 Essential (primary) hypertension: Secondary | ICD-10-CM | POA: Diagnosis not present

## 2019-12-26 DIAGNOSIS — E6609 Other obesity due to excess calories: Secondary | ICD-10-CM | POA: Diagnosis not present

## 2019-12-26 DIAGNOSIS — E7800 Pure hypercholesterolemia, unspecified: Secondary | ICD-10-CM | POA: Diagnosis not present

## 2019-12-26 DIAGNOSIS — Z0000 Encounter for general adult medical examination without abnormal findings: Secondary | ICD-10-CM | POA: Diagnosis not present

## 2019-12-26 DIAGNOSIS — R3915 Urgency of urination: Secondary | ICD-10-CM | POA: Diagnosis not present

## 2019-12-26 DIAGNOSIS — E039 Hypothyroidism, unspecified: Secondary | ICD-10-CM | POA: Diagnosis not present

## 2019-12-26 DIAGNOSIS — Z712 Person consulting for explanation of examination or test findings: Secondary | ICD-10-CM | POA: Diagnosis not present

## 2020-01-31 DIAGNOSIS — E6609 Other obesity due to excess calories: Secondary | ICD-10-CM | POA: Diagnosis not present

## 2020-01-31 DIAGNOSIS — Z6838 Body mass index (BMI) 38.0-38.9, adult: Secondary | ICD-10-CM | POA: Diagnosis not present

## 2020-01-31 DIAGNOSIS — R030 Elevated blood-pressure reading, without diagnosis of hypertension: Secondary | ICD-10-CM | POA: Diagnosis not present

## 2020-02-19 DIAGNOSIS — N390 Urinary tract infection, site not specified: Secondary | ICD-10-CM | POA: Diagnosis not present

## 2020-03-23 DIAGNOSIS — B351 Tinea unguium: Secondary | ICD-10-CM | POA: Diagnosis not present

## 2020-03-23 DIAGNOSIS — M79671 Pain in right foot: Secondary | ICD-10-CM | POA: Diagnosis not present

## 2020-03-23 DIAGNOSIS — L851 Acquired keratosis [keratoderma] palmaris et plantaris: Secondary | ICD-10-CM | POA: Diagnosis not present

## 2020-03-30 DIAGNOSIS — N771 Vaginitis, vulvitis and vulvovaginitis in diseases classified elsewhere: Secondary | ICD-10-CM | POA: Diagnosis not present

## 2020-03-30 DIAGNOSIS — I10 Essential (primary) hypertension: Secondary | ICD-10-CM | POA: Diagnosis not present

## 2020-03-30 DIAGNOSIS — R3915 Urgency of urination: Secondary | ICD-10-CM | POA: Diagnosis not present

## 2020-03-30 DIAGNOSIS — R000 Tachycardia, unspecified: Secondary | ICD-10-CM | POA: Diagnosis not present

## 2020-03-30 DIAGNOSIS — N390 Urinary tract infection, site not specified: Secondary | ICD-10-CM | POA: Diagnosis not present

## 2020-03-30 DIAGNOSIS — E6609 Other obesity due to excess calories: Secondary | ICD-10-CM | POA: Diagnosis not present

## 2020-03-30 DIAGNOSIS — Z6835 Body mass index (BMI) 35.0-35.9, adult: Secondary | ICD-10-CM | POA: Diagnosis not present

## 2020-03-30 DIAGNOSIS — Z6837 Body mass index (BMI) 37.0-37.9, adult: Secondary | ICD-10-CM | POA: Diagnosis not present

## 2020-03-30 DIAGNOSIS — Z0000 Encounter for general adult medical examination without abnormal findings: Secondary | ICD-10-CM | POA: Diagnosis not present

## 2020-04-20 DIAGNOSIS — B351 Tinea unguium: Secondary | ICD-10-CM | POA: Diagnosis not present

## 2020-04-20 DIAGNOSIS — L851 Acquired keratosis [keratoderma] palmaris et plantaris: Secondary | ICD-10-CM | POA: Diagnosis not present

## 2020-04-20 DIAGNOSIS — M79671 Pain in right foot: Secondary | ICD-10-CM | POA: Diagnosis not present

## 2020-04-27 DIAGNOSIS — E6609 Other obesity due to excess calories: Secondary | ICD-10-CM | POA: Diagnosis not present

## 2020-04-27 DIAGNOSIS — I10 Essential (primary) hypertension: Secondary | ICD-10-CM | POA: Diagnosis not present

## 2020-04-27 DIAGNOSIS — Z6838 Body mass index (BMI) 38.0-38.9, adult: Secondary | ICD-10-CM | POA: Diagnosis not present

## 2020-05-26 DIAGNOSIS — I10 Essential (primary) hypertension: Secondary | ICD-10-CM | POA: Diagnosis not present

## 2020-05-26 DIAGNOSIS — E6609 Other obesity due to excess calories: Secondary | ICD-10-CM | POA: Diagnosis not present

## 2020-05-26 DIAGNOSIS — Z6838 Body mass index (BMI) 38.0-38.9, adult: Secondary | ICD-10-CM | POA: Diagnosis not present

## 2020-06-06 ENCOUNTER — Emergency Department (HOSPITAL_COMMUNITY): Payer: BC Managed Care – PPO

## 2020-06-06 ENCOUNTER — Encounter (HOSPITAL_COMMUNITY): Payer: Self-pay | Admitting: Emergency Medicine

## 2020-06-06 ENCOUNTER — Other Ambulatory Visit: Payer: Self-pay

## 2020-06-06 ENCOUNTER — Emergency Department (HOSPITAL_COMMUNITY)
Admission: EM | Admit: 2020-06-06 | Discharge: 2020-06-07 | Disposition: A | Discharge status: Home / Self Care | Payer: BC Managed Care – PPO | Attending: Emergency Medicine | Admitting: Emergency Medicine

## 2020-06-06 DIAGNOSIS — U071 COVID-19: Secondary | ICD-10-CM | POA: Diagnosis not present

## 2020-06-06 DIAGNOSIS — Z7982 Long term (current) use of aspirin: Secondary | ICD-10-CM | POA: Insufficient documentation

## 2020-06-06 DIAGNOSIS — S0501XA Injury of conjunctiva and corneal abrasion without foreign body, right eye, initial encounter: Secondary | ICD-10-CM | POA: Diagnosis not present

## 2020-06-06 DIAGNOSIS — X58XXXA Exposure to other specified factors, initial encounter: Secondary | ICD-10-CM | POA: Insufficient documentation

## 2020-06-06 DIAGNOSIS — H18821 Corneal disorder due to contact lens, right eye: Secondary | ICD-10-CM | POA: Diagnosis not present

## 2020-06-06 DIAGNOSIS — R519 Headache, unspecified: Secondary | ICD-10-CM

## 2020-06-06 DIAGNOSIS — R112 Nausea with vomiting, unspecified: Secondary | ICD-10-CM | POA: Insufficient documentation

## 2020-06-06 DIAGNOSIS — Z87891 Personal history of nicotine dependence: Secondary | ICD-10-CM | POA: Insufficient documentation

## 2020-06-06 DIAGNOSIS — I10 Essential (primary) hypertension: Secondary | ICD-10-CM | POA: Insufficient documentation

## 2020-06-06 DIAGNOSIS — R030 Elevated blood-pressure reading, without diagnosis of hypertension: Secondary | ICD-10-CM

## 2020-06-06 NOTE — ED Triage Notes (Signed)
Pt c/o headache, nausea, and hypertension since this afternoon. Denies hx of htn

## 2020-06-07 ENCOUNTER — Emergency Department (HOSPITAL_COMMUNITY): Payer: BC Managed Care – PPO

## 2020-06-07 ENCOUNTER — Other Ambulatory Visit: Payer: Self-pay

## 2020-06-07 DIAGNOSIS — R519 Headache, unspecified: Secondary | ICD-10-CM | POA: Diagnosis not present

## 2020-06-07 DIAGNOSIS — S0501XA Injury of conjunctiva and corneal abrasion without foreign body, right eye, initial encounter: Secondary | ICD-10-CM | POA: Diagnosis not present

## 2020-06-07 DIAGNOSIS — I10 Essential (primary) hypertension: Secondary | ICD-10-CM | POA: Diagnosis not present

## 2020-06-07 LAB — CBC WITH DIFFERENTIAL/PLATELET
Abs Immature Granulocytes: 0.02 10*3/uL (ref 0.00–0.07)
Basophils Absolute: 0 10*3/uL (ref 0.0–0.1)
Basophils Relative: 0 %
Eosinophils Absolute: 0 10*3/uL (ref 0.0–0.5)
Eosinophils Relative: 1 %
HCT: 41 % (ref 36.0–46.0)
Hemoglobin: 13.7 g/dL (ref 12.0–15.0)
Immature Granulocytes: 0 %
Lymphocytes Relative: 8 %
Lymphs Abs: 0.4 10*3/uL — ABNORMAL LOW (ref 0.7–4.0)
MCH: 30.9 pg (ref 26.0–34.0)
MCHC: 33.4 g/dL (ref 30.0–36.0)
MCV: 92.6 fL (ref 80.0–100.0)
Monocytes Absolute: 0.9 10*3/uL (ref 0.1–1.0)
Monocytes Relative: 20 %
Neutro Abs: 3.3 10*3/uL (ref 1.7–7.7)
Neutrophils Relative %: 71 %
Platelets: 190 10*3/uL (ref 150–400)
RBC: 4.43 MIL/uL (ref 3.87–5.11)
RDW: 12.7 % (ref 11.5–15.5)
WBC: 4.7 10*3/uL (ref 4.0–10.5)
nRBC: 0 % (ref 0.0–0.2)

## 2020-06-07 LAB — BASIC METABOLIC PANEL
Anion gap: 9 (ref 5–15)
BUN: 11 mg/dL (ref 6–20)
CO2: 25 mmol/L (ref 22–32)
Calcium: 9.3 mg/dL (ref 8.9–10.3)
Chloride: 105 mmol/L (ref 98–111)
Creatinine, Ser: 1.05 mg/dL — ABNORMAL HIGH (ref 0.44–1.00)
GFR, Estimated: >60 mL/min (ref >60)
Glucose, Bld: 121 mg/dL — ABNORMAL HIGH (ref 70–99)
Potassium: 3.6 mmol/L (ref 3.5–5.1)
Sodium: 139 mmol/L (ref 135–145)

## 2020-06-07 LAB — SARS CORONAVIRUS 2 (TAT 6-24 HRS): SARS Coronavirus 2: POSITIVE — AB

## 2020-06-07 MED ORDER — ERYTHROMYCIN 5 MG/GM OP OINT: TOPICAL_OINTMENT | OPHTHALMIC | 0 refills | Status: DC

## 2020-06-07 MED ORDER — KETOROLAC TROMETHAMINE 30 MG/ML IJ SOLN
15.0000 mg | Freq: Once | INTRAMUSCULAR | Status: AC
  Administered 2020-06-07: 15 mg via INTRAVENOUS
  Filled 2020-06-07: qty 1

## 2020-06-07 MED ORDER — IOHEXOL 350 MG/ML SOLN
75.0000 mL | Freq: Once | INTRAVENOUS | Status: AC | PRN
  Administered 2020-06-07: 75 mL via INTRAVENOUS

## 2020-06-07 MED ORDER — AMLODIPINE BESYLATE 5 MG PO TABS
5.0000 mg | ORAL_TABLET | Freq: Once | ORAL | Status: AC
  Administered 2020-06-07: 5 mg via ORAL
  Filled 2020-06-07: qty 1

## 2020-06-07 MED ORDER — DIPHENHYDRAMINE HCL 50 MG/ML IJ SOLN
25.0000 mg | Freq: Once | INTRAMUSCULAR | Status: AC
  Administered 2020-06-07: 25 mg via INTRAVENOUS
  Filled 2020-06-07: qty 1

## 2020-06-07 MED ORDER — METOCLOPRAMIDE HCL 5 MG/ML IJ SOLN
10.0000 mg | Freq: Once | INTRAMUSCULAR | Status: AC
  Administered 2020-06-07: 10 mg via INTRAVENOUS
  Filled 2020-06-07: qty 2

## 2020-06-07 MED ORDER — TETRACAINE HCL 0.5 % OP SOLN
2.0000 [drp] | Freq: Once | OPHTHALMIC | Status: AC
  Administered 2020-06-07: 2 [drp] via OPHTHALMIC
  Filled 2020-06-07: qty 4

## 2020-06-07 MED ORDER — FLUORESCEIN SODIUM 1 MG OP STRP
1.0000 | ORAL_STRIP | Freq: Once | OPHTHALMIC | Status: AC
  Administered 2020-06-07: 1 via OPHTHALMIC
  Filled 2020-06-07: qty 1

## 2020-06-07 MED ORDER — AMLODIPINE BESYLATE 5 MG PO TABS: 5.0000 mg | ORAL_TABLET | Freq: Every day | ORAL | 0 refills | Status: DC

## 2020-06-07 NOTE — Discharge Instructions (Signed)
The imaging of your head is reassuring without evidence of bleeding, stroke or aneurysm.  You should monitor your blood pressure closely at home and keep a record of it.  Take the amlodipine as prescribed and follow-up with your doctor.  Keep your self quarantined while your Covid test is pending.  The results should be available online tomorrow.  Return to the ED with worsening headache, difficulty speaking, unilateral weakness, numbness, tingling, any other concerns

## 2020-06-07 NOTE — ED Provider Notes (Addendum)
[facility] EMERGENCY DEPARTMENT Provider Note   CSN: [id number] Arrival date & time: 06/06/20  2141     History Chief Complaint  Patient presents with  . Hypertension    Valerie Simon is a 60 y.o. female.  Patient no known medical history here with diffuse headache as well as nausea and vomiting.  States headache started around noon time while she was resting and has progressively worsened.  She denies thunderclap onset.  Describes diffuse headache in her front of her head behind her eyes.  She states there is pain behind her eyes but her vision is normal without blurry vision or spots in the vision.  No focal weakness, numbness or tingling.  No bowel or bladder incontinence.  No fever.  Did have 2 episodes of vomiting when she arrived here.  Denies photophobia or phonophobia.  Measured her blood pressure at home and was 180 systolic.  Does not normally take blood pressure medications.  Denies history of hypertension though it is listed in her chart. Patient's daughter did have Covid several weeks ago.  Patient herself is unvaccinated.  She denies any fevers, chills, body aches, cough or sore throat. She reports never having a headache like this in the past.  The history is provided by the patient and a relative.  Hypertension Associated symptoms include headaches. Pertinent negatives include no chest pain, no abdominal pain and no shortness of breath.       Past Medical History:  Diagnosis Date  . Contraceptive management 11/18/2013  . CTS (carpal tunnel syndrome)    bilateral  . Hypertension     Patient Active Problem List   Diagnosis Date Noted  . Burning with urination 02/20/2018  . Hematuria 02/20/2018  . Vaginal discharge 02/20/2018  . Vaginal itching 02/20/2018  . Yeast infection 02/20/2018  . Screening examination for STD (sexually transmitted disease) 02/20/2018  . Contraceptive management 11/18/2013    Past Surgical History:  Procedure Laterality Date  .  CHOLECYSTECTOMY       OB History    Gravida  2   Para  2   Term      Preterm      AB      Living  2     SAB      IAB      Ectopic      Multiple      Live Births              Family History  Problem Relation Age of Onset  . Heart disease Mother   . Cancer Mother        breast; has had it twice  . Diabetes Father   . Heart disease Father   . Cancer Maternal Grandmother        colon  . Diabetes Maternal Grandfather   . Cancer Maternal Grandfather        prostate    Social History   Tobacco Use  . Smoking status: Former Smoker    Types: Cigarettes  . Smokeless tobacco: Never Used  Substance Use Topics  . Alcohol use: Yes    Comment: occ  . Drug use: No    Home Medications Prior to Admission medications   Medication Sig Start Date End Date Taking? Authorizing Provider  aspirin 81 MG tablet Take 81 mg by mouth daily.    [provider]  BIOTIN PO Take by mouth daily.    [provider]  Cranberry 500 MG CAPS Take by  mouth daily.    [provider]  fluconazole (DIFLUCAN) 150 MG tablet Take 1 now and 1 in 3 days 02/20/18   Adline Potter, NP  losartan-hydrochlorothiazide (HYZAAR) 50-12.5 MG per tablet 1 tablet daily.  10/29/14   [provider]  Multiple Vitamin (MULTIVITAMIN) tablet Take 1 tablet by mouth daily.    [provider]  nitrofurantoin, macrocrystal-monohydrate, (MACROBID) 100 MG capsule Take 1 capsule (100 mg total) by mouth 2 (two) times daily. 02/26/18   Adline Potter, NP  vitamin C (ASCORBIC ACID) 500 MG tablet Take 500 mg by mouth daily.    [provider]    Allergies    Patient has no known allergies.  Review of Systems   Review of Systems  Constitutional: Negative for activity change, appetite change, fatigue and fever.  HENT: Negative for congestion and rhinorrhea.   Eyes: Positive for pain. Negative for visual disturbance.  Respiratory: Negative for cough, chest  tightness and shortness of breath.   Cardiovascular: Negative for chest pain and leg swelling.  Gastrointestinal: Positive for nausea and vomiting. Negative for abdominal pain.  Genitourinary: Negative for dysuria and hematuria.  Musculoskeletal: Negative for arthralgias, back pain and myalgias.  Skin: Negative for rash.  Neurological: Positive for dizziness and headaches. Negative for weakness.   all other systems are negative except as noted in the HPI and PMH.    Physical Exam Updated Vital Signs BP (!) 172/93   Pulse 98   Temp 99.3 F (37.4 C)   Resp 18   Ht 5\' 5"  (1.651 m)   Wt 99.8 kg   LMP 10/21/2013   SpO2 98%   BMI 36.61 kg/m   Physical Exam Vitals and nursing note reviewed.  Constitutional:      General: She is not in acute distress.    Appearance: She is well-developed and well-nourished.  HENT:     Head: Normocephalic and atraumatic.     Mouth/Throat:     Mouth: Oropharynx is clear and moist. Mucous membranes are moist.     Pharynx: No oropharyngeal exudate.  Eyes:     Extraocular Movements: EOM normal.     Conjunctiva/sclera: Conjunctivae normal.     Pupils: Pupils are equal, round, and reactive to light.     Comments: Intraocular pressure 14 on the right, 12 on the left. Linear area of fluorescein uptake at 6 o'clock position on the right side Seidel sign negative  Neck:     Comments: No meningismus. Cardiovascular:     Rate and Rhythm: Normal rate and regular rhythm.     Pulses: Intact distal pulses.     Heart sounds: Normal heart sounds. No murmur heard.   Pulmonary:     Effort: Pulmonary effort is normal. No respiratory distress.     Breath sounds: Normal breath sounds.  Abdominal:     Palpations: Abdomen is soft.     Tenderness: There is no abdominal tenderness. There is no guarding or rebound.  Musculoskeletal:        General: No tenderness or edema. Normal range of motion.     Cervical back: Normal range of motion and neck supple.  Skin:     General: Skin is warm.  Neurological:     Mental Status: She is alert and oriented to person, place, and time.     Cranial Nerves: No cranial nerve deficit.     Motor: No abnormal muscle tone.     Coordination: Coordination normal.     Comments:  No ataxia on finger to nose bilaterally. No pronator drift. 5/5 strength throughout. CN 2-12 intact.Equal grip strength. Sensation intact.  Negative romberg, normal gait  Psychiatric:        Mood and Affect: Mood and affect normal.        Behavior: Behavior normal.     ED Results / Procedures / Treatments   Labs (all labs ordered are listed, but only abnormal results are displayed) Labs Reviewed  CBC WITH DIFFERENTIAL/PLATELET - Abnormal; Notable for the following components:      Result Value   Lymphs Abs 0.4 (*)    All other components within normal limits  BASIC METABOLIC PANEL - Abnormal; Notable for the following components:   Glucose, Bld 121 (*)    Creatinine, Ser 1.05 (*)    All other components within normal limits  SARS CORONAVIRUS 2 (TAT 6-24 HRS)    EKG EKG Interpretation  Date/Time:  Sunday June 07 2020 00:11:34 EST Ventricular Rate:  97 PR Interval:    QRS Duration: 94 QT Interval:  371 QTC Calculation: 472 R Axis:   -30 Text Interpretation: Sinus rhythm Abnormal R-wave progression, late transition Left ventricular hypertrophy No previous ECGs available Confirmed by Glynn Octaveancour, Tremain Rucinski [phone]([id number]) on 06/07/2020 12:34:39 AM   Radiology CT Angio Head W or Wo Contrast  Result Date: 06/07/2020 CLINICAL DATA:  Sudden onset of headache.  Possible stroke. EXAM: CT ANGIOGRAPHY HEAD AND NECK TECHNIQUE: Multidetector CT imaging of the head and neck was performed using the standard protocol during bolus administration of intravenous contrast. Multiplanar CT image reconstructions and MIPs were obtained to evaluate the vascular anatomy. Carotid stenosis measurements (when applicable) are obtained utilizing NASCET criteria, using  the distal internal carotid diameter as the denominator. CONTRAST:  75mL OMNIPAQUE IOHEXOL 350 MG/ML SOLN COMPARISON:  Head CT same day FINDINGS: CTA NECK FINDINGS Aortic arch: Normal Right carotid system: Normal Left carotid system: Normal Vertebral arteries: Both vertebral arteries are patent with the left being dominant in the right being very diminutive. Skeleton: Ordinary mid cervical spondylosis. Other neck: No mass or lymphadenopathy. Upper chest: Negative Review of the MIP images confirms the above findings CTA HEAD FINDINGS Anterior circulation: Both internal carotid arteries widely patent through the skull base and siphon regions. No siphon atherosclerotic disease or stenosis. The anterior and middle cerebral vessels are normal. No occlusion, aneurysm or vascular malformation. Posterior circulation: Both vertebral arteries widely patent to the basilar. No basilar stenosis. Posterior circulation branch vessels are normal. Large posterior communicating arteries. Venous sinuses: Patent and normal. Anatomic variants: None Review of the MIP images confirms the above findings IMPRESSION: Normal CT angiography of the head and neck. No atherosclerotic disease. No stenosis. No large or medium vessel occlusion. No aneurysm or vascular malformation. Electronically Signed   By: Paulina FusiMark  Shogry M.D.   On: 06/07/2020 01:33   CT Head Wo Contrast  Result Date: 06/06/2020 CLINICAL DATA:  Headache. Nausea and hypertension since this afternoon. EXAM: CT HEAD WITHOUT CONTRAST TECHNIQUE: Contiguous axial images were obtained from the base of the skull through the vertex without intravenous contrast. COMPARISON:  None. FINDINGS: Brain: No evidence of acute infarction, hemorrhage, hydrocephalus, extra-axial collection or mass lesion/mass effect. Vascular: No hyperdense vessel or unexpected calcification. Skull: Normal. Negative for fracture or focal lesion. Sinuses/Orbits: Globes and orbits are unremarkable. Visualized sinuses  are clear. Other: None. IMPRESSION: Normal unenhanced CT scan of the brain. Electronically Signed   By: Amie Portlandavid  Ormond M.D.   On: 06/06/2020 23:10   CT Angio  Neck W and/or Wo Contrast  Result Date: 06/07/2020 CLINICAL DATA:  Sudden onset of headache.  Possible stroke. EXAM: CT ANGIOGRAPHY HEAD AND NECK TECHNIQUE: Multidetector CT imaging of the head and neck was performed using the standard protocol during bolus administration of intravenous contrast. Multiplanar CT image reconstructions and MIPs were obtained to evaluate the vascular anatomy. Carotid stenosis measurements (when applicable) are obtained utilizing NASCET criteria, using the distal internal carotid diameter as the denominator. CONTRAST:  30mL OMNIPAQUE IOHEXOL 350 MG/ML SOLN COMPARISON:  Head CT same day FINDINGS: CTA NECK FINDINGS Aortic arch: Normal Right carotid system: Normal Left carotid system: Normal Vertebral arteries: Both vertebral arteries are patent with the left being dominant in the right being very diminutive. Skeleton: Ordinary mid cervical spondylosis. Other neck: No mass or lymphadenopathy. Upper chest: Negative Review of the MIP images confirms the above findings CTA HEAD FINDINGS Anterior circulation: Both internal carotid arteries widely patent through the skull base and siphon regions. No siphon atherosclerotic disease or stenosis. The anterior and middle cerebral vessels are normal. No occlusion, aneurysm or vascular malformation. Posterior circulation: Both vertebral arteries widely patent to the basilar. No basilar stenosis. Posterior circulation branch vessels are normal. Large posterior communicating arteries. Venous sinuses: Patent and normal. Anatomic variants: None Review of the MIP images confirms the above findings IMPRESSION: Normal CT angiography of the head and neck. No atherosclerotic disease. No stenosis. No large or medium vessel occlusion. No aneurysm or vascular malformation. Electronically Signed   By: Paulina Fusi M.D.   On: 06/07/2020 01:33    Procedures Procedures   Medications Ordered in ED Medications  metoCLOPramide (REGLAN) injection 10 mg (has no administration in time range)  diphenhydrAMINE (BENADRYL) injection 25 mg (has no administration in time range)    ED Course  I have reviewed the triage vital signs and the nursing notes.  Pertinent labs & imaging results that were available during my care of the patient were reviewed by me and considered in my medical decision making (see chart for details).    MDM Rules/Calculators/A&P                         Patient with progressively worsening headache since approximately noon.  Associated with nausea and vomiting as well as eye pain.  Neurological exam is nonfocal.  She denies thunderclap onset. Lower suspicion for subarachnoid hemorrhage, meningitis, temporal arteritis.   CT head in triage is negative. Covid swab sent.  Patient hypertensive without history of the same.  States she is no longer on blood pressure medications. She stopped them on her own after she lost weight and does not normally monitor her blood pressure.  Denies chest pain.  EKG is sinus rhythm with LVH.  CT is negative for hemorrhage or aneurysm.  Patient given fluid challenge. She does have a corneal abrasion of the right eye but intraocular pressures are normal.  Blood pressure improved to 150s over 80s.  Headache has resolved and nausea has resolved.  She is able to tolerate p.o.  Results discussed with patient.  Imaging is negative for hemorrhage, stroke or aneurysm.  Advised to monitor her blood pressure on a daily basis and follow-up with her PCP.  Will start low-dose amlodipine. Also treat her corneal abrasion with topical antibiotics.  The pain of this may be contributing to her elevated blood pressure and headache. Tetanus is up to date.   Follow-up with PCP as well as neurology.  She is informed  of her pending Covid results and understands to keep her  self quarantine until this resolves. Return to the ED with worsening headache, visual change, unilateral numbness, weakness, tingling, any  other concerns.   AURIANNA EARLYWINE was evaluated in Emergency Department on 06/07/2020 for the symptoms described in the history of present illness. She was evaluated in the context of the global COVID-19 pandemic, which necessitated consideration that the patient might be at risk for infection with the SARS-CoV-2 virus that causes COVID-19. Institutional protocols and algorithms that pertain to the evaluation of patients at risk for COVID-19 are in a state of rapid change based on information released by regulatory bodies including the CDC and federal and state organizations. These policies and algorithms were followed during the patient's care in the ED.  Final Clinical Impression(s) / ED Diagnoses Final diagnoses:  Bad headache  Elevated blood pressure reading  Abrasion of right cornea, initial encounter    Rx / DC Orders ED Discharge Orders    None       Savion Washam, Jeannett Senior, MD 06/07/20 1610    Glynn Octave, MD 06/07/20 [phone]

## 2020-06-07 NOTE — ED Notes (Signed)
Pt tolerated PO challenge

## 2020-06-08 ENCOUNTER — Telehealth: Payer: Self-pay

## 2020-06-08 DIAGNOSIS — I10 Essential (primary) hypertension: Secondary | ICD-10-CM | POA: Diagnosis not present

## 2020-06-08 DIAGNOSIS — R519 Headache, unspecified: Secondary | ICD-10-CM | POA: Diagnosis not present

## 2020-06-08 DIAGNOSIS — U071 COVID-19: Secondary | ICD-10-CM | POA: Diagnosis not present

## 2020-06-08 NOTE — Telephone Encounter (Signed)
Called to discuss with patient about COVID-19 symptoms and the use of one of the available treatments for those with mild to moderate Covid symptoms and at a high risk of hospitalization.  Pt appears to qualify for outpatient treatment due to co-morbid conditions and/or a member of an at-risk group in accordance with the FDA Emergency Use Authorization.    Symptom onset: 06/06/20 per ED note. Vaccinated: Unknown Booster? Unknown Immunocompromised? No Qualifiers: HTN  Unable to reach pt - Left message with call back number [phone].   Esther Hardy

## 2020-06-11 DIAGNOSIS — M791 Myalgia, unspecified site: Secondary | ICD-10-CM | POA: Diagnosis not present

## 2020-06-11 DIAGNOSIS — Z8616 Personal history of COVID-19: Secondary | ICD-10-CM | POA: Diagnosis not present

## 2020-06-11 DIAGNOSIS — R519 Headache, unspecified: Secondary | ICD-10-CM | POA: Diagnosis not present

## 2020-06-11 DIAGNOSIS — I10 Essential (primary) hypertension: Secondary | ICD-10-CM | POA: Diagnosis not present

## 2020-07-20 DIAGNOSIS — L851 Acquired keratosis [keratoderma] palmaris et plantaris: Secondary | ICD-10-CM | POA: Diagnosis not present

## 2020-07-20 DIAGNOSIS — B351 Tinea unguium: Secondary | ICD-10-CM | POA: Diagnosis not present

## 2020-07-20 DIAGNOSIS — M79671 Pain in right foot: Secondary | ICD-10-CM | POA: Diagnosis not present

## 2020-08-21 DIAGNOSIS — S0501XA Injury of conjunctiva and corneal abrasion without foreign body, right eye, initial encounter: Secondary | ICD-10-CM | POA: Diagnosis not present

## 2020-09-15 DIAGNOSIS — N390 Urinary tract infection, site not specified: Secondary | ICD-10-CM | POA: Diagnosis not present

## 2020-09-15 DIAGNOSIS — R309 Painful micturition, unspecified: Secondary | ICD-10-CM | POA: Diagnosis not present

## 2020-11-24 DIAGNOSIS — R030 Elevated blood-pressure reading, without diagnosis of hypertension: Secondary | ICD-10-CM | POA: Diagnosis not present

## 2020-11-24 DIAGNOSIS — N390 Urinary tract infection, site not specified: Secondary | ICD-10-CM | POA: Diagnosis not present

## 2020-11-24 DIAGNOSIS — R159 Full incontinence of feces: Secondary | ICD-10-CM | POA: Diagnosis not present

## 2020-11-24 DIAGNOSIS — R309 Painful micturition, unspecified: Secondary | ICD-10-CM | POA: Diagnosis not present

## 2020-12-01 ENCOUNTER — Other Ambulatory Visit: Payer: Self-pay

## 2020-12-01 ENCOUNTER — Encounter (INDEPENDENT_AMBULATORY_CARE_PROVIDER_SITE_OTHER): Payer: Self-pay | Admitting: Family Medicine

## 2020-12-01 ENCOUNTER — Ambulatory Visit (INDEPENDENT_AMBULATORY_CARE_PROVIDER_SITE_OTHER): Payer: BLUE CROSS/BLUE SHIELD | Admitting: Family Medicine

## 2020-12-01 VITALS — BP 180/104 | HR 82 | Temp 98.0°F | Ht 66.0 in | Wt 233.0 lb

## 2020-12-01 DIAGNOSIS — Z0289 Encounter for other administrative examinations: Secondary | ICD-10-CM

## 2020-12-01 DIAGNOSIS — Z1331 Encounter for screening for depression: Secondary | ICD-10-CM | POA: Diagnosis not present

## 2020-12-01 DIAGNOSIS — R6889 Other general symptoms and signs: Secondary | ICD-10-CM | POA: Diagnosis not present

## 2020-12-01 DIAGNOSIS — N390 Urinary tract infection, site not specified: Secondary | ICD-10-CM | POA: Diagnosis not present

## 2020-12-01 DIAGNOSIS — R0602 Shortness of breath: Secondary | ICD-10-CM

## 2020-12-01 DIAGNOSIS — I10 Essential (primary) hypertension: Secondary | ICD-10-CM

## 2020-12-01 DIAGNOSIS — Z6837 Body mass index (BMI) 37.0-37.9, adult: Secondary | ICD-10-CM

## 2020-12-01 DIAGNOSIS — R5383 Other fatigue: Secondary | ICD-10-CM

## 2020-12-01 DIAGNOSIS — Z9189 Other specified personal risk factors, not elsewhere classified: Secondary | ICD-10-CM | POA: Diagnosis not present

## 2020-12-01 MED ORDER — LOSARTAN POTASSIUM 50 MG PO TABS: 50.0000 mg | ORAL_TABLET | Freq: Every day | ORAL | 0 refills | Status: DC

## 2020-12-02 LAB — CBC WITH DIFFERENTIAL/PLATELET
Basophils Absolute: 0 10*3/uL (ref 0.0–0.2)
Basos: 1 %
EOS (ABSOLUTE): 0.1 10*3/uL (ref 0.0–0.4)
Eos: 2 %
Hematocrit: 43.3 % (ref 34.0–46.6)
Hemoglobin: 14.6 g/dL (ref 11.1–15.9)
Immature Grans (Abs): 0 10*3/uL (ref 0.0–0.1)
Immature Granulocytes: 0 %
Lymphocytes Absolute: 2.1 10*3/uL (ref 0.7–3.1)
Lymphs: 33 %
MCH: 30.2 pg (ref 26.6–33.0)
MCHC: 33.7 g/dL (ref 31.5–35.7)
MCV: 90 fL (ref 79–97)
Monocytes Absolute: 0.8 10*3/uL (ref 0.1–0.9)
Monocytes: 12 %
Neutrophils Absolute: 3.4 10*3/uL (ref 1.4–7.0)
Neutrophils: 52 %
Platelets: 237 10*3/uL (ref 150–450)
RBC: 4.84 x10E6/uL (ref 3.77–5.28)
RDW: 13 % (ref 11.7–15.4)
WBC: 6.4 10*3/uL (ref 3.4–10.8)

## 2020-12-02 LAB — T3: T3, Total: 134 ng/dL (ref 71–180)

## 2020-12-02 LAB — COMPREHENSIVE METABOLIC PANEL
ALT: 15 IU/L (ref 0–32)
AST: 22 IU/L (ref 0–40)
Albumin/Globulin Ratio: 1.8 (ref 1.2–2.2)
Albumin: 4.5 g/dL (ref 3.8–4.9)
Alkaline Phosphatase: 121 IU/L (ref 44–121)
BUN/Creatinine Ratio: 19 (ref 9–23)
BUN: 19 mg/dL (ref 6–24)
Bilirubin Total: 0.3 mg/dL (ref 0.0–1.2)
CO2: 23 mmol/L (ref 20–29)
Calcium: 9.5 mg/dL (ref 8.7–10.2)
Chloride: 102 mmol/L (ref 96–106)
Creatinine, Ser: 1 mg/dL (ref 0.57–1.00)
Globulin, Total: 2.5 g/dL (ref 1.5–4.5)
Glucose: 94 mg/dL (ref 65–99)
Potassium: 4.4 mmol/L (ref 3.5–5.2)
Sodium: 141 mmol/L (ref 134–144)
Total Protein: 7 g/dL (ref 6.0–8.5)
eGFR: 66 mL/min/{1.73_m2} (ref >59)

## 2020-12-02 LAB — LIPID PANEL WITH LDL/HDL RATIO
Cholesterol, Total: 202 mg/dL — ABNORMAL HIGH (ref 100–199)
HDL: 70 mg/dL (ref >39)
LDL Chol Calc (NIH): 118 mg/dL — ABNORMAL HIGH (ref 0–99)
LDL/HDL Ratio: 1.7 ratio (ref 0.0–3.2)
Triglycerides: 80 mg/dL (ref 0–149)
VLDL Cholesterol Cal: 14 mg/dL (ref 5–40)

## 2020-12-02 LAB — TSH: TSH: 2.83 u[IU]/mL (ref 0.450–4.500)

## 2020-12-02 LAB — INSULIN, RANDOM: INSULIN: 12.2 u[IU]/mL (ref 2.6–24.9)

## 2020-12-02 LAB — FOLATE: Folate: >20 ng/mL (ref >3.0)

## 2020-12-02 LAB — HEMOGLOBIN A1C
Est. average glucose Bld gHb Est-mCnc: 126 mg/dL
Hgb A1c MFr Bld: 6 % — ABNORMAL HIGH (ref 4.8–5.6)

## 2020-12-02 LAB — VITAMIN B12: Vitamin B-12: 532 pg/mL (ref 232–1245)

## 2020-12-02 LAB — VITAMIN D 25 HYDROXY (VIT D DEFICIENCY, FRACTURES): Vit D, 25-Hydroxy: 57.2 ng/mL (ref 30.0–100.0)

## 2020-12-02 NOTE — Progress Notes (Signed)
Chief Complaint:   OBESITY Valerie Simon (MR# [id number]) is a 60 y.o. female who presents for evaluation and treatment of obesity and related comorbidities. Current BMI is Body mass index is 37.61 kg/m. Valerie Simon has been struggling with her weight for many years and has been unsuccessful in either losing weight, maintaining weight loss, or reaching her healthy weight goal.  Valerie Simon is currently in the action stage of change and ready to dedicate time achieving and maintaining a healthier weight. Valerie Simon is interested in becoming our patient and working on intensive lifestyle modifications including (but not limited to) diet and exercise for weight loss.  Referred by current patient at Dr. Scharlene Gloss office. Valerie Simon lost 88 lbs previously with low carb plan. She is going out 3-4 times a week. Toast with cheese (2 slices) (satisfied) or pita with egg (satisfied); She usually does not snack; Lunch is sometimes out [employer] [place] without rice or cheeseburger + fries (feel full) + sweet tea; Dinner- pizza or spaghetti or steak, salad + sweet potato fries (15-20).  Valerie Simon's habits were reviewed today and are as follows: her desired weight loss is 48 lbs, she has been heavy most of her life, she started gaining weight when she was young, her heaviest weight ever was 275 pounds, she has significant food cravings issues, she snacks frequently in the evenings, she is frequently drinking liquids with calories, she frequently makes poor food choices, she has problems with excessive hunger, she frequently eats larger portions than normal, she has binge eating behaviors, and she struggles with emotional eating.  Depression Screen Valerie Simon's Food and Mood (modified PHQ-9) score was 19.  Depression screen PHQ 2/9 12/01/2020  Decreased Interest 3  Down, Depressed, Hopeless 3  PHQ - 2 Score 6  Altered sleeping 3  Tired, decreased energy 3  Change in appetite 1  Feeling bad or failure about yourself  3  Trouble  concentrating 1  Moving slowly or fidgety/restless 1  Suicidal thoughts 1  PHQ-9 Score 19  Difficult doing work/chores Somewhat difficult   Subjective:   1. Other fatigue Valerie Simon admits to daytime somnolence and admits to waking up still tired. Patent has a history of symptoms of daytime fatigue and morning fatigue. [name] generally gets 7 hours of sleep per night, and states that she has poor sleep quality. Snoring is present. Apneic episodes are not present. Epworth Sleepiness Score is 10. EKG- NSR at 77 bpm and LVH.  2. SOB (shortness of breath) Valerie Simon notes increasing shortness of breath with exercising and seems to be worsening over time with weight gain. She notes getting out of breath sooner with activity than she used to. This has gotten worse recently. [name] denies shortness of breath at rest or orthopnea. EKG- NSR at 77 bpm and LVH.  3. Primary hypertension Valerie Simon's BP was previously treated pharmacologically with amlodipine and losartan. Pt denies chest pain/chest pressure/headache. EKG- NSR at 77 bpm and LVH.  4. Frequent UTI Pt has had numerous UTI's due to fecal incontinence. Normal pathogen is E.coli.  5. At risk for heart disease Valerie Simon is at a higher than average risk for cardiovascular disease due to obesity.   Assessment/Plan:   1. Other fatigue [name] does feel that her weight is causing her energy to be lower than it should be. Fatigue may be related to obesity, depression or many other causes. Labs will be ordered, and in the meanwhile, [name] will focus on self care including making healthy food choices, increasing  physical activity and focusing on stress reduction. Check labs today.  - EKG 12-Lead - Vitamin B12 - Folate - Hemoglobin A1c - Insulin, random - T4, free - TSH - VITAMIN D 25 Hydroxy (Vit-D Deficiency, Fractures) - T3  2. SOB (shortness of breath) Valerie Simon does feel that she gets out of breath more easily that she used to when she exercises.  Valerie Simon's shortness of breath appears to be obesity related and exercise induced. She has agreed to work on weight loss and gradually increase exercise to treat her exercise induced shortness of breath. Will continue to monitor closely. Check labs today.  - CBC with Differential/Platelet - Lipid Panel With LDL/HDL Ratio - ECHOCARDIOGRAM COMPLETE  3. Primary hypertension Valerie Simon is working on healthy weight loss and exercise to improve blood pressure control. We will watch for signs of hypotension as she continues her lifestyle modifications. Check labs today.  - Comprehensive metabolic panel  Refill- losartan (COZAAR) 50 MG tablet; Take 1 tablet (50 mg total) by mouth daily.  Dispense: 30 tablet; Refill: 0  - ECHOCARDIOGRAM COMPLETE  4. Frequent UTI Follow up at next appt. Pelvic fluor PT discussed.  5. Depression screening Valerie Simon had a positive depression screening. Depression is commonly associated with obesity and often results in emotional eating behaviors. We will monitor this closely and work on CBT to help improve the non-hunger eating patterns. Referral to Psychology may be required if no improvement is seen as she continues in our clinic.  6. At risk for heart disease Valerie Simon was given approximately 15 minutes of coronary artery disease prevention counseling today. She is 60 y.o. female and has risk factors for heart disease including obesity. We discussed intensive lifestyle modifications today with an emphasis on specific weight loss instructions and strategies.   Repetitive spaced learning was employed today to elicit superior memory formation and behavioral change.  7. Obesity with current BMI of 37.7  Valerie Simon is currently in the action stage of change and her goal is to continue with weight loss efforts. I recommend Valerie Simon begin the structured treatment plan as follows:  She has agreed to the Category 2 Plan.  Exercise goals: All adults should avoid inactivity. Some  physical activity is better than none, and adults who participate in any amount of physical activity gain some health benefits.   Behavioral modification strategies: increasing lean protein intake, meal planning and cooking strategies, keeping healthy foods in the home, and planning for success.  She was informed of the importance of frequent follow-up visits to maximize her success with intensive lifestyle modifications for her multiple health conditions. She was informed we would discuss her lab results at her next visit unless there is a critical issue that needs to be addressed sooner. Valerie Simon agreed to keep her next visit at the agreed upon time to discuss these results.  Objective:   Blood pressure (!) 180/104, pulse 82, temperature 98 F (36.7 C), height 5\' 6"  (1.676 m), weight 233 lb (105.7 kg), last menstrual period 10/21/2013, SpO2 98 %. Body mass index is 37.61 kg/m.  EKG: Sinus rhythm, rate 77- Abnormal- LVH.  Indirect Calorimeter completed today shows a VO2 of 192 and a REE of 1339.  Her calculated basal metabolic rate is 10/23/2013 thus her basal metabolic rate is worse than expected.  General: Cooperative, alert, well developed, in no acute distress. HEENT: Conjunctivae and lids unremarkable. Cardiovascular: Regular rhythm.  Lungs: Normal work of breathing. Neurologic: No focal deficits.   Lab Results  Component Value Date  CREATININE 1.00 12/01/2020   BUN 19 12/01/2020   NA 141 12/01/2020   K 4.4 12/01/2020   CL 102 12/01/2020   CO2 23 12/01/2020   Lab Results  Component Value Date   ALT 15 12/01/2020   AST 22 12/01/2020   ALKPHOS 121 12/01/2020   BILITOT 0.3 12/01/2020   Lab Results  Component Value Date   HGBA1C 6.0 (H) 12/01/2020   Lab Results  Component Value Date   INSULIN 12.2 12/01/2020   Lab Results  Component Value Date   TSH 2.830 12/01/2020   Lab Results  Component Value Date   CHOL 202 (H) 12/01/2020   HDL 70 12/01/2020   LDLCALC 118 (H)  12/01/2020   TRIG 80 12/01/2020   Lab Results  Component Value Date   WBC 6.4 12/01/2020   HGB 14.6 12/01/2020   HCT 43.3 12/01/2020   MCV 90 12/01/2020   PLT 237 12/01/2020    Attestation Statements:   Reviewed by clinician on day of visit: allergies, medications, problem list, medical history, surgical history, family history, social history, and previous encounter notes.  Edmund Hilda, CMA, am acting as transcriptionist for Reuben Likes, MD.  This is the patient's first visit at [facility]. The patient's NEW PATIENT PACKET was reviewed at length. Included in the packet: current and past health history, medications, allergies, ROS, gynecologic history (women only), surgical history, family history, social history, weight history, weight loss surgery history (for those that have had weight loss surgery), nutritional evaluation, mood and food questionnaire, PHQ9, Epworth questionnaire, sleep habits questionnaire, patient life and health improvement goals questionnaire. These will all be scanned into the patient's chart under media.   During the visit, I independently reviewed the patient's EKG, bioimpedance scale results, and indirect calorimeter results. I used this information to tailor a meal plan for the patient that will help her to lose weight and will improve her obesity-related conditions going forward. I performed a medically necessary appropriate examination and/or evaluation. I discussed the assessment and treatment plan with the patient. The patient was provided an opportunity to ask questions and all were answered. The patient agreed with the plan and demonstrated an understanding of the instructions. Labs were ordered at this visit and will be reviewed at the next visit unless more critical results need to be addressed immediately. Clinical information was updated and documented in the EMR.   Time spent on visit including pre-visit chart review and  post-visit care was 45 minutes.   A separate 15 minutes was spent on risk counseling (see above).   I have reviewed the above documentation for accuracy and completeness, and I agree with the above. - Reuben Likes, MD

## 2020-12-07 ENCOUNTER — Encounter (INDEPENDENT_AMBULATORY_CARE_PROVIDER_SITE_OTHER): Payer: Self-pay | Admitting: Family Medicine

## 2020-12-14 ENCOUNTER — Other Ambulatory Visit: Payer: Self-pay

## 2020-12-14 ENCOUNTER — Encounter (INDEPENDENT_AMBULATORY_CARE_PROVIDER_SITE_OTHER): Payer: Self-pay | Admitting: Family Medicine

## 2020-12-14 ENCOUNTER — Ambulatory Visit (INDEPENDENT_AMBULATORY_CARE_PROVIDER_SITE_OTHER): Payer: BC Managed Care – PPO | Admitting: Family Medicine

## 2020-12-14 VITALS — BP 153/95 | HR 88 | Temp 98.3°F | Ht 66.0 in | Wt 229.0 lb

## 2020-12-14 DIAGNOSIS — E7849 Other hyperlipidemia: Secondary | ICD-10-CM

## 2020-12-14 DIAGNOSIS — I10 Essential (primary) hypertension: Secondary | ICD-10-CM | POA: Diagnosis not present

## 2020-12-14 DIAGNOSIS — Z6837 Body mass index (BMI) 37.0-37.9, adult: Secondary | ICD-10-CM

## 2020-12-14 DIAGNOSIS — R7303 Prediabetes: Secondary | ICD-10-CM

## 2020-12-15 NOTE — Progress Notes (Signed)
Chief Complaint:   OBESITY Valerie Simon is here to discuss her progress with her obesity treatment plan along with follow-up of her obesity related diagnoses. Valerie Simon is on the Category 2 Plan and states she is following her eating plan approximately 50% of the time. Valerie Simon states she is walking 10,000 steps 6 times per week.  Today's visit was #: 2 Starting weight: 233 lbs Starting date: 12/01/2020 Today's weight: 229 lbs Today's date: 12/14/2020 Total lbs lost to date: 4 Total lbs lost since last in-office visit: 4  Interim History: Valerie Simon ate the food down in her own house before going to the store. She felt it was enough food and denies hunger. For snacks, she got 140 calorie cheese, sausage packs and some popcorn. She didn't like the [place] yogurt she bought. She hasn't weighed out protein for lunch or dinner. She has no plans for the next couple of weeks.  Subjective:   1. Primary hypertension BP lowered from previous reading. Valerie Simon is on losartan 50 mg daily.  2. Pre-diabetes Her A1c 6.0 and insulin level 12.2. She is not on meds currently.  3. Other hyperlipidemia Valerie Simon's LDL is 118, HDL 70, and triglycerides 80. She is not on statin therapy.  Assessment/Plan:   1. Primary hypertension Valerie Simon is working on healthy weight loss and exercise to improve blood pressure control. We will watch for signs of hypotension as she continues her lifestyle modifications. Continue losartan with no change in dose. Follow up BP at next appt.  2. Pre-diabetes Valerie Simon will continue to work on weight loss, exercise, and decreasing simple carbohydrates to help decrease the risk of diabetes. No meds at this time. Follow up labs in 3 months. If cravings increase or hunger increases, consider addition of medication.  3. Other hyperlipidemia Cardiovascular risk and specific lipid/LDL goals reviewed.  We discussed several lifestyle modifications today and [name] will continue to work on diet, exercise  and weight loss efforts. Orders and follow up as documented in patient record. Follow up labs in 3 months.  Counseling Intensive lifestyle modifications are the first line treatment for this issue. Dietary changes: Increase soluble fiber. Decrease simple carbohydrates. Exercise changes: Moderate to vigorous-intensity aerobic activity 150 minutes per week if tolerated. Lipid-lowering medications: see documented in medical record.  4. Obesity with current BMI of 37.1  Valerie Simon is currently in the action stage of change. As such, her goal is to continue with weight loss efforts. She has agreed to the Category 2 Plan.   Exercise goals: All adults should avoid inactivity. Some physical activity is better than none, and adults who participate in any amount of physical activity gain some health benefits.  Behavioral modification strategies: increasing lean protein intake, meal planning and cooking strategies, keeping healthy foods in the home, and planning for success.  Valerie Simon has agreed to follow-up with our clinic in 2-3 weeks. She was informed of the importance of frequent follow-up visits to maximize her success with intensive lifestyle modifications for her multiple health conditions.   Objective:   Blood pressure (!) 153/95, pulse 88, temperature 98.3 F (36.8 C), height 5\' 6"  (1.676 m), weight 229 lb (103.9 kg), last menstrual period 10/21/2013, SpO2 98 %. Body mass index is 36.96 kg/m.  General: Cooperative, alert, well developed, in no acute distress. HEENT: Conjunctivae and lids unremarkable. Cardiovascular: Regular rhythm.  Lungs: Normal work of breathing. Neurologic: No focal deficits.   Lab Results  Component Value Date   CREATININE 1.00 12/01/2020   BUN 19  12/01/2020   NA 141 12/01/2020   K 4.4 12/01/2020   CL 102 12/01/2020   CO2 23 12/01/2020   Lab Results  Component Value Date   ALT 15 12/01/2020   AST 22 12/01/2020   ALKPHOS 121 12/01/2020   BILITOT 0.3  12/01/2020   Lab Results  Component Value Date   HGBA1C 6.0 (H) 12/01/2020   Lab Results  Component Value Date   INSULIN 12.2 12/01/2020   Lab Results  Component Value Date   TSH 2.830 12/01/2020   Lab Results  Component Value Date   CHOL 202 (H) 12/01/2020   HDL 70 12/01/2020   LDLCALC 118 (H) 12/01/2020   TRIG 80 12/01/2020   Lab Results  Component Value Date   VD25OH 57.2 12/01/2020   Lab Results  Component Value Date   WBC 6.4 12/01/2020   HGB 14.6 12/01/2020   HCT 43.3 12/01/2020   MCV 90 12/01/2020   PLT 237 12/01/2020    Attestation Statements:   Reviewed by clinician on day of visit: allergies, medications, problem list, medical history, surgical history, family history, social history, and previous encounter notes.  Time spent on visit including pre-visit chart review and post-visit care and charting was 25 minutes.   Edmund Hilda, CMA, am acting as transcriptionist for Reuben Likes, MD.   I have reviewed the above documentation for accuracy and completeness, and I agree with the above. - Reuben Likes, MD

## 2020-12-29 ENCOUNTER — Ambulatory Visit (INDEPENDENT_AMBULATORY_CARE_PROVIDER_SITE_OTHER): Payer: BC Managed Care – PPO | Admitting: Physician Assistant

## 2020-12-29 ENCOUNTER — Other Ambulatory Visit: Payer: Self-pay

## 2020-12-29 ENCOUNTER — Encounter (INDEPENDENT_AMBULATORY_CARE_PROVIDER_SITE_OTHER): Payer: Self-pay | Admitting: Physician Assistant

## 2020-12-29 ENCOUNTER — Ambulatory Visit (HOSPITAL_COMMUNITY): Payer: BC Managed Care – PPO | Attending: Internal Medicine

## 2020-12-29 VITALS — BP 140/87 | HR 83 | Temp 97.8°F | Ht 66.0 in | Wt 230.0 lb

## 2020-12-29 DIAGNOSIS — Z9189 Other specified personal risk factors, not elsewhere classified: Secondary | ICD-10-CM | POA: Diagnosis not present

## 2020-12-29 DIAGNOSIS — I10 Essential (primary) hypertension: Secondary | ICD-10-CM | POA: Diagnosis not present

## 2020-12-29 DIAGNOSIS — R7303 Prediabetes: Secondary | ICD-10-CM | POA: Diagnosis not present

## 2020-12-29 DIAGNOSIS — R0602 Shortness of breath: Secondary | ICD-10-CM | POA: Insufficient documentation

## 2020-12-29 DIAGNOSIS — Z6837 Body mass index (BMI) 37.0-37.9, adult: Secondary | ICD-10-CM

## 2020-12-29 LAB — ECHOCARDIOGRAM COMPLETE
Area-P 1/2: 3.6 cm2
Height: 66 in
P 1/2 time: 348 msec
S' Lateral: 3.1 cm
Weight: 3680 oz

## 2020-12-29 MED ORDER — LOSARTAN POTASSIUM 50 MG PO TABS: 50.0000 mg | ORAL_TABLET | Freq: Every day | ORAL | 0 refills | Status: DC

## 2020-12-29 NOTE — Progress Notes (Signed)
Chief Complaint:   OBESITY Valerie Simon is here to discuss her progress with her obesity treatment plan along with follow-up of her obesity related diagnoses. Valerie Simon is on the Category 2 Plan and states she is following her eating plan approximately 3-4% of the time. Valerie Simon states she is walking 45 minutes 7 times per week.  Today's visit was #: 3 Starting weight: 233 lbs Starting date: 12/01/2020 Today's weight: 230 lbs Today's date: 12/29/2020 Total lbs lost to date: 3 Total lbs lost since last in-office visit: 0  Interim History: Valerie Simon sometimes only has cheese toast for breakfast. Her biggest struggle is with dinner, as she is eating out a lot.  Subjective:   1. Primary hypertension Valerie Simon denies headache or chest pain. She is on losartan daily.  2. Pre-diabetes Her last A1c was 6.0. she is not on medication and denies polyphagia. She is exercising 7 days a week.  3. At risk for heart disease Valerie Simon is at a higher than average risk for cardiovascular disease due to obesity.   Assessment/Plan:   1. Primary hypertension Valerie Simon is working on healthy weight loss and exercise to improve blood pressure control. We will watch for signs of hypotension as she continues her lifestyle modifications.  Refill- losartan (COZAAR) 50 MG tablet; Take 1 tablet (50 mg total) by mouth daily.  Dispense: 30 tablet; Refill: 0  2. Pre-diabetes Valerie Simon will continue to work on weight loss, exercise, and decreasing simple carbohydrates to help decrease the risk of diabetes.   3. At risk for heart disease Valerie Simon was given approximately 15 minutes of coronary artery disease prevention counseling today. She is 60 y.o. female and has risk factors for heart disease including obesity. We discussed intensive lifestyle modifications today with an emphasis on specific weight loss instructions and strategies.   Repetitive spaced learning was employed today to elicit superior memory formation and behavioral  change.  4. Obesity with current BMI of 37.14  Valerie Simon is currently in the action stage of change. As such, her goal is to continue with weight loss efforts. She has agreed to keeping a food journal and adhering to recommended goals of 1100-1200 calories and 90 grams protein.   Exercise goals:  As is  Behavioral modification strategies: meal planning and cooking strategies and keeping healthy foods in the home.  Valerie Simon has agreed to follow-up with our clinic in 2 weeks. She was informed of the importance of frequent follow-up visits to maximize her success with intensive lifestyle modifications for her multiple health conditions.   Objective:   Blood pressure 140/87, pulse 83, temperature 97.8 F (36.6 C), height 5\' 6"  (1.676 m), weight 230 lb (104.3 kg), last menstrual period 10/21/2013, SpO2 98 %. Body mass index is 37.12 kg/m.  General: Cooperative, alert, well developed, in no acute distress. HEENT: Conjunctivae and lids unremarkable. Cardiovascular: Regular rhythm.  Lungs: Normal work of breathing. Neurologic: No focal deficits.   Lab Results  Component Value Date   CREATININE 1.00 12/01/2020   BUN 19 12/01/2020   NA 141 12/01/2020   K 4.4 12/01/2020   CL 102 12/01/2020   CO2 23 12/01/2020   Lab Results  Component Value Date   ALT 15 12/01/2020   AST 22 12/01/2020   ALKPHOS 121 12/01/2020   BILITOT 0.3 12/01/2020   Lab Results  Component Value Date   HGBA1C 6.0 (H) 12/01/2020   Lab Results  Component Value Date   INSULIN 12.2 12/01/2020   Lab Results  Component Value  Date   TSH 2.830 12/01/2020   Lab Results  Component Value Date   CHOL 202 (H) 12/01/2020   HDL 70 12/01/2020   LDLCALC 118 (H) 12/01/2020   TRIG 80 12/01/2020   Lab Results  Component Value Date   VD25OH 57.2 12/01/2020   Lab Results  Component Value Date   WBC 6.4 12/01/2020   HGB 14.6 12/01/2020   HCT 43.3 12/01/2020   MCV 90 12/01/2020   PLT 237 12/01/2020   Attestation  Statements:   Reviewed by clinician on day of visit: allergies, medications, problem list, medical history, surgical history, family history, social history, and previous encounter notes.  Edmund Hilda, CMA, am acting as transcriptionist for [employer], PA-C.  I have reviewed the above documentation for accuracy and completeness, and I agree with the above. Alois Cliche, PA-C

## 2020-12-30 NOTE — Telephone Encounter (Signed)
Pt last seen by Tracey Aguilar, PA-C.  

## 2020-12-30 NOTE — Telephone Encounter (Signed)
Echo was ordered by you although patient last seen Tracey. Please advise.  Annalysia Willenbring, CMA

## 2021-01-04 DIAGNOSIS — Z0001 Encounter for general adult medical examination with abnormal findings: Secondary | ICD-10-CM | POA: Diagnosis not present

## 2021-01-15 ENCOUNTER — Other Ambulatory Visit: Payer: Self-pay

## 2021-01-15 ENCOUNTER — Ambulatory Visit (INDEPENDENT_AMBULATORY_CARE_PROVIDER_SITE_OTHER): Payer: BC Managed Care – PPO | Admitting: Physician Assistant

## 2021-01-15 VITALS — BP 122/83 | HR 89 | Temp 98.1°F | Ht 66.0 in | Wt 228.0 lb

## 2021-01-15 DIAGNOSIS — R7303 Prediabetes: Secondary | ICD-10-CM

## 2021-01-15 DIAGNOSIS — Z6837 Body mass index (BMI) 37.0-37.9, adult: Secondary | ICD-10-CM | POA: Diagnosis not present

## 2021-01-18 NOTE — Progress Notes (Signed)
Chief Complaint:   OBESITY Valerie Simon is here to discuss her progress with her obesity treatment plan along with follow-up of her obesity related diagnoses. Valerie Simon is on the Category 2 Plan and states she is following her eating plan approximately 90% of the time. Valerie Simon states she is walking for 5 miles 7 times per week.  Today's visit was #: 4 Starting weight: 233 lbs Starting date: 12/01/2020 Today's weight: 228 lbs Today's date: 01/15/2021 Total lbs lost to date: 5 lbs Total lbs lost since last in-office visit: 2 lbs  Interim History: Valerie Simon reports drinking more milk. She is trying to make smarter decisions when eating out. She is cooking more often at home. She is asking about Ozempic.  Subjective:   1. Prediabetes Valerie Simon is not on medications currently. Her last A1C level was 6.0. She is asking about Ozempic as PCP  mentioned to her. Her mother has a history of thyroid cancer but Valerie Simon is unsure as to what type.   Assessment/Plan:   1. Prediabetes Valerie Simon will get mother's medical records. She will continue to work on weight loss, exercise, and decreasing simple carbohydrates to help decrease the risk of diabetes.    2. Obesity with current BMI of 36.82 Valerie Simon is currently in the action stage of change. As such, her goal is to continue with weight loss efforts. She has agreed to the Category 2 Plan.   Exercise goals:  As is.  Behavioral modification strategies: meal planning and cooking strategies and planning for success.  Valerie Simon has agreed to follow-up with our clinic in 2 weeks. She was informed of the importance of frequent follow-up visits to maximize her success with intensive lifestyle modifications for her multiple health conditions.   Objective:   Blood pressure 122/83, pulse 89, temperature 98.1 F (36.7 C), height 5\' 6"  (1.676 m), weight 228 lb (103.4 kg), last menstrual period 10/21/2013, SpO2 97 %. Body mass index is 36.8 kg/m.  General: Cooperative,  alert, well developed, in no acute distress. HEENT: Conjunctivae and lids unremarkable. Cardiovascular: Regular rhythm.  Lungs: Normal work of breathing. Neurologic: No focal deficits.   Lab Results  Component Value Date   CREATININE 1.00 12/01/2020   BUN 19 12/01/2020   NA 141 12/01/2020   K 4.4 12/01/2020   CL 102 12/01/2020   CO2 23 12/01/2020   Lab Results  Component Value Date   ALT 15 12/01/2020   AST 22 12/01/2020   ALKPHOS 121 12/01/2020   BILITOT 0.3 12/01/2020   Lab Results  Component Value Date   HGBA1C 6.0 (H) 12/01/2020   Lab Results  Component Value Date   INSULIN 12.2 12/01/2020   Lab Results  Component Value Date   TSH 2.830 12/01/2020   Lab Results  Component Value Date   CHOL 202 (H) 12/01/2020   HDL 70 12/01/2020   LDLCALC 118 (H) 12/01/2020   TRIG 80 12/01/2020   Lab Results  Component Value Date   VD25OH 57.2 12/01/2020   Lab Results  Component Value Date   WBC 6.4 12/01/2020   HGB 14.6 12/01/2020   HCT 43.3 12/01/2020   MCV 90 12/01/2020   PLT 237 12/01/2020   No results found for: IRON, TIBC, FERRITIN  Attestation Statements:   Reviewed by clinician on day of visit: allergies, medications, problem list, medical history, surgical history, family history, social history, and previous encounter notes.  Time spent on visit including pre-visit chart review and post-visit care and charting was 32 minutes.  I, Sindy Messing, am acting as Energy manager for [employer], PA-C.   I have reviewed the above documentation for accuracy and completeness, and I agree with the above. Alois Cliche, PA-C

## 2021-02-01 ENCOUNTER — Ambulatory Visit (INDEPENDENT_AMBULATORY_CARE_PROVIDER_SITE_OTHER): Payer: BC Managed Care – PPO | Admitting: Physician Assistant

## 2021-02-01 ENCOUNTER — Encounter (INDEPENDENT_AMBULATORY_CARE_PROVIDER_SITE_OTHER): Payer: Self-pay | Admitting: Physician Assistant

## 2021-02-01 ENCOUNTER — Other Ambulatory Visit: Payer: Self-pay

## 2021-02-01 VITALS — BP 123/82 | HR 79 | Temp 97.9°F | Ht 66.0 in | Wt 228.0 lb

## 2021-02-01 DIAGNOSIS — R7303 Prediabetes: Secondary | ICD-10-CM

## 2021-02-01 DIAGNOSIS — Z9189 Other specified personal risk factors, not elsewhere classified: Secondary | ICD-10-CM | POA: Diagnosis not present

## 2021-02-01 DIAGNOSIS — Z6837 Body mass index (BMI) 37.0-37.9, adult: Secondary | ICD-10-CM | POA: Diagnosis not present

## 2021-02-01 DIAGNOSIS — E66812 Obesity, class 2: Secondary | ICD-10-CM

## 2021-02-01 MED ORDER — TIRZEPATIDE 2.5 MG/0.5ML ~~LOC~~ SOAJ
2.5000 mg | SUBCUTANEOUS | 0 refills | Status: DC
Start: 2021-02-01 — End: 2021-03-15

## 2021-02-01 NOTE — Progress Notes (Signed)
Chief Complaint:   OBESITY Valerie Simon is here to discuss her progress with her obesity treatment plan along with follow-up of her obesity related diagnoses. Valerie Simon is on the Category 2 Plan and states she is following her eating plan approximately 90% of the time. Valerie Simon states she is walking 5 miles 7 times per week.  Today's visit was #: 5 Starting weight: 233 lbs Starting date: 12/01/2020 Today's weight: 228 lbs Today's date: 02/01/2021 Total lbs lost to date: 5 Total lbs lost since last in-office visit: 0  Interim History: Valerie Simon reports that she is trying to stay on the plan but is having issues with cravings and excessive hunger on some days.  Subjective:   1. Pre-diabetes Valerie Simon is not on medication. She reports polyphagia and cravings. Her last A1c was 6.0.  2. At risk for diabetes mellitus Valerie Simon is at higher than average risk for developing diabetes due to obesity.   Assessment/Plan:   1. Pre-diabetes Valerie Simon will start Mounjaro 2.5 mg and continue to work on weight loss, exercise, and decreasing simple carbohydrates to help decrease the risk of diabetes.   Start- tirzepatide [facility]) 2.5 MG/0.5ML Pen; Inject 2.5 mg into the skin once a week.  Dispense: 2 mL; Refill: 0  2. At risk for diabetes mellitus Valerie Simon was given approximately 15 minutes of diabetes education and counseling today. We discussed intensive lifestyle modifications today with an emphasis on weight loss as well as increasing exercise and decreasing simple carbohydrates in her diet. We also reviewed medication options with an emphasis on risk versus benefit of those discussed.   Repetitive spaced learning was employed today to elicit superior memory formation and behavioral change.  3. Obesity with current BMI of 36.82  Valerie Simon is currently in the action stage of change. As such, her goal is to continue with weight loss efforts. She has agreed to the Category 2 Plan.   Exercise goals:  As  is  Behavioral modification strategies: meal planning and cooking strategies and avoiding temptations.  Valerie Simon has agreed to follow-up with our clinic in 2-3 weeks. She was informed of the importance of frequent follow-up visits to maximize her success with intensive lifestyle modifications for her multiple health conditions.   Objective:   Blood pressure 123/82, pulse 79, temperature 97.9 F (36.6 C), temperature source Oral, height 5\' 6"  (1.676 m), weight 228 lb (103.4 kg), last menstrual period 10/21/2013, SpO2 98 %. Body mass index is 36.8 kg/m.  General: Cooperative, alert, well developed, in no acute distress. HEENT: Conjunctivae and lids unremarkable. Cardiovascular: Regular rhythm.  Lungs: Normal work of breathing. Neurologic: No focal deficits.   Lab Results  Component Value Date   CREATININE 1.00 12/01/2020   BUN 19 12/01/2020   NA 141 12/01/2020   K 4.4 12/01/2020   CL 102 12/01/2020   CO2 23 12/01/2020   Lab Results  Component Value Date   ALT 15 12/01/2020   AST 22 12/01/2020   ALKPHOS 121 12/01/2020   BILITOT 0.3 12/01/2020   Lab Results  Component Value Date   HGBA1C 6.0 (H) 12/01/2020   Lab Results  Component Value Date   INSULIN 12.2 12/01/2020   Lab Results  Component Value Date   TSH 2.830 12/01/2020   Lab Results  Component Value Date   CHOL 202 (H) 12/01/2020   HDL 70 12/01/2020   LDLCALC 118 (H) 12/01/2020   TRIG 80 12/01/2020   Lab Results  Component Value Date   VD25OH 57.2 12/01/2020  Lab Results  Component Value Date   WBC 6.4 12/01/2020   HGB 14.6 12/01/2020   HCT 43.3 12/01/2020   MCV 90 12/01/2020   PLT 237 12/01/2020    Attestation Statements:   Reviewed by clinician on day of visit: allergies, medications, problem list, medical history, surgical history, family history, social history, and previous encounter notes.  Edmund Hilda, CMA, am acting as transcriptionist for [employer], PA-C.  I have reviewed  the above documentation for accuracy and completeness, and I agree with the above. Alois Cliche, PA-C

## 2021-02-22 ENCOUNTER — Ambulatory Visit (INDEPENDENT_AMBULATORY_CARE_PROVIDER_SITE_OTHER): Payer: BC Managed Care – PPO | Admitting: Physician Assistant

## 2021-02-22 ENCOUNTER — Other Ambulatory Visit: Payer: Self-pay

## 2021-02-22 ENCOUNTER — Encounter (INDEPENDENT_AMBULATORY_CARE_PROVIDER_SITE_OTHER): Payer: Self-pay | Admitting: Physician Assistant

## 2021-02-22 VITALS — BP 127/90 | HR 81 | Temp 98.3°F | Ht 66.0 in | Wt 229.0 lb

## 2021-02-22 DIAGNOSIS — R7303 Prediabetes: Secondary | ICD-10-CM | POA: Diagnosis not present

## 2021-02-22 DIAGNOSIS — Z6837 Body mass index (BMI) 37.0-37.9, adult: Secondary | ICD-10-CM | POA: Diagnosis not present

## 2021-02-22 NOTE — Progress Notes (Signed)
Chief Complaint:   OBESITY Valerie Simon is here to discuss her progress with her obesity treatment plan along with follow-up of her obesity related diagnoses. Valerie Simon is on the Category 2 Plan and states she is following her eating plan approximately 90% of the time. Valerie Simon states she is walking 5 miles 5 times per week.  Today's visit was #: 6 Starting weight: 233 lbs Starting date: 12/01/2020 Today's weight: 229 lbs Today's date: 02/22/2021 Total lbs lost to date: 4 Total lbs lost since last in-office visit: 0  Interim History: Valerie Simon reports that she has been slightly off plan the last few weeks due to life stressors. She is only eating 4-5 oz of protein at dinner. She is walking often but not doing any resistance training.  Subjective:   1. Pre-diabetes Valerie Simon just started [facility] this morning. She denies nausea. Her last A1c was 6.0.  Assessment/Plan:   1. Pre-diabetes Valerie Simon will continue to work on weight loss, exercise, and decreasing simple carbohydrates to help decrease the risk of diabetes. Continue with Mounjaro and weight loss.  2. Obesity with current BMI of 36.98  Valerie Simon is currently in the action stage of change. As such, her goal is to continue with weight loss efforts. She has agreed to the Category 2 Plan.   Exercise goals:  Start resistance bands 2 times a week.  Behavioral modification strategies: increasing lean protein intake and meal planning and cooking strategies.  Valerie Simon has agreed to follow-up with our clinic in 3 weeks. She was informed of the importance of frequent follow-up visits to maximize her success with intensive lifestyle modifications for her multiple health conditions.   Objective:   Blood pressure 127/90, pulse 81, temperature 98.3 F (36.8 C), height 5\' 6"  (1.676 m), weight 229 lb (103.9 kg), last menstrual period 10/21/2013, SpO2 99 %. Body mass index is 36.96 kg/m.  General: Cooperative, alert, well developed, in no acute  distress. HEENT: Conjunctivae and lids unremarkable. Cardiovascular: Regular rhythm.  Lungs: Normal work of breathing. Neurologic: No focal deficits.   Lab Results  Component Value Date   CREATININE 1.00 12/01/2020   BUN 19 12/01/2020   NA 141 12/01/2020   K 4.4 12/01/2020   CL 102 12/01/2020   CO2 23 12/01/2020   Lab Results  Component Value Date   ALT 15 12/01/2020   AST 22 12/01/2020   ALKPHOS 121 12/01/2020   BILITOT 0.3 12/01/2020   Lab Results  Component Value Date   HGBA1C 6.0 (H) 12/01/2020   Lab Results  Component Value Date   INSULIN 12.2 12/01/2020   Lab Results  Component Value Date   TSH 2.830 12/01/2020   Lab Results  Component Value Date   CHOL 202 (H) 12/01/2020   HDL 70 12/01/2020   LDLCALC 118 (H) 12/01/2020   TRIG 80 12/01/2020   Lab Results  Component Value Date   VD25OH 57.2 12/01/2020   Lab Results  Component Value Date   WBC 6.4 12/01/2020   HGB 14.6 12/01/2020   HCT 43.3 12/01/2020   MCV 90 12/01/2020   PLT 237 12/01/2020    Attestation Statements:   Reviewed by clinician on day of visit: allergies, medications, problem list, medical history, surgical history, family history, social history, and previous encounter notes.  Time spent on visit including pre-visit chart review and post-visit care and charting was 32 minutes.   01/31/2021, CMA, am acting as transcriptionist for Valerie Hilda, PA-C.  I have reviewed the above documentation  for accuracy and completeness, and I agree with the above. Alois Cliche, PA-C

## 2021-03-14 ENCOUNTER — Encounter (INDEPENDENT_AMBULATORY_CARE_PROVIDER_SITE_OTHER): Payer: Self-pay

## 2021-03-15 ENCOUNTER — Ambulatory Visit (INDEPENDENT_AMBULATORY_CARE_PROVIDER_SITE_OTHER): Payer: BC Managed Care – PPO | Admitting: Physician Assistant

## 2021-03-15 ENCOUNTER — Other Ambulatory Visit (INDEPENDENT_AMBULATORY_CARE_PROVIDER_SITE_OTHER): Payer: Self-pay

## 2021-03-15 DIAGNOSIS — R7303 Prediabetes: Secondary | ICD-10-CM

## 2021-03-15 MED ORDER — TIRZEPATIDE 2.5 MG/0.5ML ~~LOC~~ SOAJ: 2.5000 mg | SUBCUTANEOUS | 0 refills | Status: DC

## 2021-03-15 NOTE — Telephone Encounter (Signed)
LAST APPOINTMENT DATE: 02/22/21 NEXT APPOINTMENT DATE: 03/23/21   [facility] - [place], [place] - [address] [place] [address] Phone: [phone] Fax: [phone]  Patient is requesting a refill of the following medications: Requested Prescriptions   Pending Prescriptions Disp Refills   tirzepatide (MOUNJARO) 2.5 MG/0.5ML Pen 2 mL 0    Sig: Inject 2.5 mg into the skin once a week.    Date last filled: 02/01/21 Previously prescribed by Alois Cliche  Lab Results  Component Value Date   HGBA1C 6.0 (H) 12/01/2020   Lab Results  Component Value Date   LDLCALC 118 (H) 12/01/2020   CREATININE 1.00 12/01/2020   Lab Results  Component Value Date   VD25OH 57.2 12/01/2020    BP Readings from Last 3 Encounters:  02/22/21 127/90  02/01/21 123/82  01/15/21 122/83

## 2021-03-22 ENCOUNTER — Encounter (INDEPENDENT_AMBULATORY_CARE_PROVIDER_SITE_OTHER): Payer: Self-pay | Admitting: Physician Assistant

## 2021-03-23 ENCOUNTER — Ambulatory Visit (INDEPENDENT_AMBULATORY_CARE_PROVIDER_SITE_OTHER): Payer: BC Managed Care – PPO | Admitting: Family Medicine

## 2021-03-23 NOTE — Telephone Encounter (Signed)
Pt last seen by Tracey Aguilar, PA-C.  

## 2021-04-07 ENCOUNTER — Other Ambulatory Visit: Payer: Self-pay

## 2021-04-07 ENCOUNTER — Ambulatory Visit (INDEPENDENT_AMBULATORY_CARE_PROVIDER_SITE_OTHER): Payer: BC Managed Care – PPO | Admitting: Physician Assistant

## 2021-04-07 ENCOUNTER — Encounter (INDEPENDENT_AMBULATORY_CARE_PROVIDER_SITE_OTHER): Payer: Self-pay | Admitting: Physician Assistant

## 2021-04-07 VITALS — BP 113/77 | HR 95 | Temp 98.0°F | Ht 66.0 in | Wt 226.0 lb

## 2021-04-07 DIAGNOSIS — K219 Gastro-esophageal reflux disease without esophagitis: Secondary | ICD-10-CM

## 2021-04-07 DIAGNOSIS — Z9189 Other specified personal risk factors, not elsewhere classified: Secondary | ICD-10-CM

## 2021-04-07 DIAGNOSIS — R7303 Prediabetes: Secondary | ICD-10-CM | POA: Diagnosis not present

## 2021-04-07 DIAGNOSIS — E66812 Obesity, class 2: Secondary | ICD-10-CM

## 2021-04-07 DIAGNOSIS — Z6837 Body mass index (BMI) 37.0-37.9, adult: Secondary | ICD-10-CM

## 2021-04-07 MED ORDER — TIRZEPATIDE 2.5 MG/0.5ML ~~LOC~~ SOAJ: 2.5000 mg | SUBCUTANEOUS | 0 refills | Status: DC

## 2021-04-07 NOTE — Progress Notes (Addendum)
Chief Complaint:   OBESITY Valerie Simon is here to discuss her progress with her obesity treatment plan along with follow-up of her obesity related diagnoses. Valerie Simon is on the Category 2 Plan and states she is following her eating plan approximately 90% of the time. Valerie Simon states she is walking 120 minutes 3-4 times per week.  Today's visit was #: 7 Starting weight: 233 lbs Starting date: 12/01/2020 Today's weight: 226 lbs Today's date: 04/07/2021 Total lbs lost to date: 7 Total lbs lost since last in-office visit: 3  Interim History: Valerie Simon is getting heartburn with Mounjaro. She increased her Prevacid to twice daily, which has helped but not fully relieved symptoms. She does not feel that her hunger is fully controlled.  Subjective:   1. Gastroesophageal reflux disease, unspecified whether esophagitis present Pt is experiencing epigastric discomfort, belching, cough since starting Mounjaro. No blood in stool or black stools. She is planning on getting a colonoscopy soon.  2. Prediabetes Pt's reflux is worsening with Mounjaro 2.5 mg and her appetite is not yet controlled.  3. At risk for nausea [name] is at risk for nausea due to [facility]  Assessment/Plan:   1. Gastroesophageal reflux disease, unspecified whether esophagitis present Valerie Simon will start Pantoprazole 40 mg as directed. Intensive lifestyle modifications are the first line treatment for this issue. We discussed several lifestyle modifications today and she will continue to work on diet, exercise and weight loss efforts. Orders and follow up as documented in patient record.  Start Pantoprazole 40 mg QAM, Disp #30, 0 RF  Counseling If a person has gastroesophageal reflux disease (GERD), food and stomach acid move back up into the esophagus and cause symptoms or problems such as damage to the esophagus. Anti-reflux measures include: raising the head of the bed, avoiding tight clothing or belts, avoiding eating late at  night, not lying down shortly after mealtime, and achieving weight loss. Avoid ASA, NSAID's, caffeine, alcohol, and tobacco.  OTC Pepcid and/or Tums are often very helpful for as needed use.  However, for persisting chronic or daily symptoms, stronger medications like Omeprazole may be needed. You may need to avoid foods and drinks such as: Coffee and tea (with or without caffeine). Drinks that contain alcohol. Energy drinks and sports drinks. Bubbly (carbonated) drinks or sodas. Chocolate and cocoa. Peppermint and mint flavorings. Garlic and onions. Horseradish. Spicy and acidic foods. These include peppers, chili powder, curry powder, vinegar, hot sauces, and BBQ sauce. Citrus fruit juices and citrus fruits, such as oranges, lemons, and limes. Tomato-based foods. These include red sauce, chili, salsa, and pizza with red sauce. Fried and fatty foods. These include donuts, french fries, potato chips, and high-fat dressings. High-fat meats. These include hot dogs, rib eye steak, sausage, ham, and bacon.  2. Prediabetes Valerie Simon will continue to work on weight loss, exercise, and decreasing simple carbohydrates to help decrease the risk of diabetes. Continue Mounjaro for now. We will consider discontinuing if symptoms do not improve with pantoprazole.  Refill- tirzepatide (MOUNJARO) 2.5 MG/0.5ML Pen; Inject 2.5 mg into the skin once a week.  Dispense: 2 mL; Refill: 0  3. At risk for nausea Valerie Simon was given approximately 15 minutes of nausea prevention counseling today. Valerie Simon is at risk for nausea due to her new or current medication. She was encouraged to titrate her medication slowly, make sure to stay hydrated, eat smaller portions throughout the day, and avoid high fat meals.   4. Obesity with current BMI of 36.98  Valerie Simon  is currently in the action stage of change. As such, her goal is to continue with weight loss efforts. She has agreed to the Category 2 Plan.   Exercise goals:   As is  Behavioral modification strategies: increasing lean protein intake, meal planning and cooking strategies, and keeping healthy foods in the home.  Valerie Simon has agreed to follow-up with our clinic in 3 weeks. She was informed of the importance of frequent follow-up visits to maximize her success with intensive lifestyle modifications for her multiple health conditions.   Objective:   Blood pressure 113/77, pulse 95, temperature 98 F (36.7 C), height 5\' 6"  (1.676 m), weight 226 lb (102.5 kg), last menstrual period 10/21/2013, SpO2 99 %. Body mass index is 36.48 kg/m.  General: Cooperative, alert, well developed, in no acute distress. HEENT: Conjunctivae and lids unremarkable. Cardiovascular: Regular rhythm.  Lungs: Normal work of breathing. Neurologic: No focal deficits.   Lab Results  Component Value Date   CREATININE 1.00 12/01/2020   BUN 19 12/01/2020   NA 141 12/01/2020   K 4.4 12/01/2020   CL 102 12/01/2020   CO2 23 12/01/2020   Lab Results  Component Value Date   ALT 15 12/01/2020   AST 22 12/01/2020   ALKPHOS 121 12/01/2020   BILITOT 0.3 12/01/2020   Lab Results  Component Value Date   HGBA1C 6.0 (H) 12/01/2020   Lab Results  Component Value Date   INSULIN 12.2 12/01/2020   Lab Results  Component Value Date   TSH 2.830 12/01/2020   Lab Results  Component Value Date   CHOL 202 (H) 12/01/2020   HDL 70 12/01/2020   LDLCALC 118 (H) 12/01/2020   TRIG 80 12/01/2020   Lab Results  Component Value Date   VD25OH 57.2 12/01/2020   Lab Results  Component Value Date   WBC 6.4 12/01/2020   HGB 14.6 12/01/2020   HCT 43.3 12/01/2020   MCV 90 12/01/2020   PLT 237 12/01/2020    Attestation Statements:   Reviewed by clinician on day of visit: allergies, medications, problem list, medical history, surgical history, family history, social history, and previous encounter notes.  01/31/2021, CMA, am acting as transcriptionist for Edmund Hilda,  PA-C.  I have reviewed the above documentation for accuracy and completeness, and I agree with the above. [employer], PA-C

## 2021-04-12 ENCOUNTER — Other Ambulatory Visit (INDEPENDENT_AMBULATORY_CARE_PROVIDER_SITE_OTHER): Payer: Self-pay | Admitting: Physician Assistant

## 2021-04-12 MED ORDER — PANTOPRAZOLE SODIUM 40 MG PO TBEC: 40.0000 mg | DELAYED_RELEASE_TABLET | Freq: Every day | ORAL | 0 refills | Status: DC

## 2021-04-12 NOTE — Addendum Note (Signed)
Addended by: Alois Cliche on: 04/12/2021 12:40 PM   Modules accepted: Orders

## 2021-05-06 ENCOUNTER — Encounter (INDEPENDENT_AMBULATORY_CARE_PROVIDER_SITE_OTHER): Payer: Self-pay | Admitting: Physician Assistant

## 2021-05-06 ENCOUNTER — Ambulatory Visit (INDEPENDENT_AMBULATORY_CARE_PROVIDER_SITE_OTHER): Payer: BC Managed Care – PPO | Admitting: Physician Assistant

## 2021-05-06 ENCOUNTER — Other Ambulatory Visit: Payer: Self-pay

## 2021-05-06 VITALS — BP 115/76 | HR 82 | Temp 98.0°F | Ht 66.0 in | Wt 224.0 lb

## 2021-05-06 DIAGNOSIS — Z6837 Body mass index (BMI) 37.0-37.9, adult: Secondary | ICD-10-CM

## 2021-05-06 DIAGNOSIS — Z9189 Other specified personal risk factors, not elsewhere classified: Secondary | ICD-10-CM

## 2021-05-06 DIAGNOSIS — K219 Gastro-esophageal reflux disease without esophagitis: Secondary | ICD-10-CM | POA: Diagnosis not present

## 2021-05-06 DIAGNOSIS — R7303 Prediabetes: Secondary | ICD-10-CM | POA: Diagnosis not present

## 2021-05-06 MED ORDER — PANTOPRAZOLE SODIUM 40 MG PO TBEC: 40.0000 mg | DELAYED_RELEASE_TABLET | Freq: Every day | ORAL | 0 refills | Status: DC

## 2021-05-06 MED ORDER — TIRZEPATIDE 2.5 MG/0.5ML ~~LOC~~ SOAJ: 2.5000 mg | SUBCUTANEOUS | 0 refills | Status: DC

## 2021-05-06 NOTE — Progress Notes (Signed)
Chief Complaint:   OBESITY Valerie Simon is here to discuss her progress with her obesity treatment plan along with follow-up of her obesity related diagnoses. Valerie Simon is on the Category 2 Plan and states she is following her eating plan approximately 60-70% of the time. Valerie Simon states she is walking for 60 minutes 4 times per week.  Today's visit was #: 8 Starting weight: 233 lbs Starting date: 12/01/2020 Today's weight: 224 lbs Today's date: 05/06/2021 Total lbs lost to date: 9 lbs Total lbs lost since last in-office visit: 2 lbs  Interim History: Valerie Simon is getting all of her protein in but is challenged with getting enough vegetables.  Hunger is controlled most days.  Subjective:   1. Prediabetes She is on Mounjaro 2.5 mg.  She says her hunger is mostly controlled.  2. Gastroesophageal reflux disease, unspecified whether esophagitis present Valerie Simon was changed to Protonix and she says it helped with reflux at night.  3. At risk for diabetes mellitus Valerie Simon is at higher than average risk for developing diabetes due to obesity.   Assessment/Plan:   1. Prediabetes Valerie Simon will continue to work on weight loss, exercise, and decreasing simple carbohydrates to help decrease the risk of diabetes. Continue Mounjaro 2.5 mg weekly.  Will refill today.  - Refill tirzepatide (MOUNJARO) 2.5 MG/0.5ML Pen; Inject 2.5 mg into the skin once a week.  Dispense: 6 mL; Refill: 0  2. Gastroesophageal reflux disease, unspecified whether esophagitis present Intensive lifestyle modifications are the first line treatment for this issue. We discussed several lifestyle modifications today and she will continue to work on diet, exercise and weight loss efforts. Orders and follow up as documented in patient record. Continue Protonix.  Will refill today.  Counseling If a person has gastroesophageal reflux disease (GERD), food and stomach acid move back up into the esophagus and cause symptoms or problems such as  damage to the esophagus. Anti-reflux measures include: raising the head of the bed, avoiding tight clothing or belts, avoiding eating late at night, not lying down shortly after mealtime, and achieving weight loss. Avoid ASA, NSAID's, caffeine, alcohol, and tobacco.  OTC Pepcid and/or Tums are often very helpful for as needed use.  However, for persisting chronic or daily symptoms, stronger medications like Omeprazole may be needed. You may need to avoid foods and drinks such as: Coffee and tea (with or without caffeine). Drinks that contain alcohol. Energy drinks and sports drinks. Bubbly (carbonated) drinks or sodas. Chocolate and cocoa. Peppermint and mint flavorings. Garlic and onions. Horseradish. Spicy and acidic foods. These include peppers, chili powder, curry powder, vinegar, hot sauces, and BBQ sauce. Citrus fruit juices and citrus fruits, such as oranges, lemons, and limes. Tomato-based foods. These include red sauce, chili, salsa, and pizza with red sauce. Fried and fatty foods. These include donuts, french fries, potato chips, and high-fat dressings. High-fat meats. These include hot dogs, rib eye steak, sausage, ham, and bacon.  - Refill pantoprazole (PROTONIX) 40 MG tablet; Take 1 tablet (40 mg total) by mouth daily.  Dispense: 30 tablet; Refill: 0  3. At risk for diabetes mellitus Valerie Simon was given approximately 15 minutes of diabetes education and counseling today. We discussed intensive lifestyle modifications today with an emphasis on weight loss as well as increasing exercise and decreasing simple carbohydrates in her diet. We also reviewed medication options with an emphasis on risk versus benefit of those discussed.   Repetitive spaced learning was employed today to elicit superior memory formation and behavioral change.  4. Obesity with current BMI of 36.17  Valerie Simon is currently in the action stage of change. As such, her goal is to continue with weight loss efforts.  She has agreed to the Category 2 Plan.   Exercise goals:  As is.  Behavioral modification strategies: increasing vegetables and meal planning and cooking strategies.  Valerie Simon has agreed to follow-up with our clinic in 2 weeks. She was informed of the importance of frequent follow-up visits to maximize her success with intensive lifestyle modifications for her multiple health conditions.   Objective:   Blood pressure 115/76, pulse 82, temperature 98 F (36.7 C), height 5\' 6"  (1.676 m), weight 224 lb (101.6 kg), last menstrual period 10/21/2013, SpO2 99 %. Body mass index is 36.15 kg/m.  General: Cooperative, alert, well developed, in no acute distress. HEENT: Conjunctivae and lids unremarkable. Cardiovascular: Regular rhythm.  Lungs: Normal work of breathing. Neurologic: No focal deficits.   Lab Results  Component Value Date   CREATININE 1.00 12/01/2020   BUN 19 12/01/2020   NA 141 12/01/2020   K 4.4 12/01/2020   CL 102 12/01/2020   CO2 23 12/01/2020   Lab Results  Component Value Date   ALT 15 12/01/2020   AST 22 12/01/2020   ALKPHOS 121 12/01/2020   BILITOT 0.3 12/01/2020   Lab Results  Component Value Date   HGBA1C 6.0 (H) 12/01/2020   Lab Results  Component Value Date   INSULIN 12.2 12/01/2020   Lab Results  Component Value Date   TSH 2.830 12/01/2020   Lab Results  Component Value Date   CHOL 202 (H) 12/01/2020   HDL 70 12/01/2020   LDLCALC 118 (H) 12/01/2020   TRIG 80 12/01/2020   Lab Results  Component Value Date   VD25OH 57.2 12/01/2020   Lab Results  Component Value Date   WBC 6.4 12/01/2020   HGB 14.6 12/01/2020   HCT 43.3 12/01/2020   MCV 90 12/01/2020   PLT 237 12/01/2020   Attestation Statements:   Reviewed by clinician on day of visit: allergies, medications, problem list, medical history, surgical history, family history, social history, and previous encounter notes.  I, Water quality scientist, CMA, am acting as transcriptionist for Abby Potash, PA-C  I have reviewed the above documentation for accuracy and completeness, and I agree with the above. Abby Potash, PA-C

## 2021-05-21 DIAGNOSIS — G5602 Carpal tunnel syndrome, left upper limb: Secondary | ICD-10-CM | POA: Diagnosis not present

## 2021-05-21 DIAGNOSIS — Z1211 Encounter for screening for malignant neoplasm of colon: Secondary | ICD-10-CM | POA: Diagnosis not present

## 2021-05-25 ENCOUNTER — Ambulatory Visit (INDEPENDENT_AMBULATORY_CARE_PROVIDER_SITE_OTHER): Payer: BC Managed Care – PPO | Admitting: Physician Assistant

## 2021-05-25 ENCOUNTER — Other Ambulatory Visit: Payer: Self-pay

## 2021-05-25 ENCOUNTER — Encounter (INDEPENDENT_AMBULATORY_CARE_PROVIDER_SITE_OTHER): Payer: Self-pay | Admitting: Physician Assistant

## 2021-05-25 VITALS — BP 119/76 | HR 86 | Temp 97.9°F | Ht 66.0 in | Wt 224.0 lb

## 2021-05-25 DIAGNOSIS — Z6837 Body mass index (BMI) 37.0-37.9, adult: Secondary | ICD-10-CM

## 2021-05-25 DIAGNOSIS — R7303 Prediabetes: Secondary | ICD-10-CM | POA: Diagnosis not present

## 2021-05-25 DIAGNOSIS — Z6836 Body mass index (BMI) 36.0-36.9, adult: Secondary | ICD-10-CM

## 2021-05-25 DIAGNOSIS — E669 Obesity, unspecified: Secondary | ICD-10-CM

## 2021-05-25 DIAGNOSIS — E66812 Obesity, class 2: Secondary | ICD-10-CM

## 2021-05-25 DIAGNOSIS — Z9189 Other specified personal risk factors, not elsewhere classified: Secondary | ICD-10-CM

## 2021-05-25 DIAGNOSIS — R12 Heartburn: Secondary | ICD-10-CM

## 2021-05-25 MED ORDER — PANTOPRAZOLE SODIUM 40 MG PO TBEC: 40.0000 mg | DELAYED_RELEASE_TABLET | Freq: Every day | ORAL | 0 refills | Status: DC

## 2021-05-25 NOTE — Progress Notes (Signed)
Chief Complaint:   OBESITY Valerie Simon is here to discuss her progress with her obesity treatment plan along with follow-up of her obesity related diagnoses. Valerie Simon is on the Category 2 Plan and states she is following her eating plan approximately 90% of the time. Valerie Simon states she is walking 60 minutes 7 times per week.  Today's visit was #: 9 Starting weight: 233 lbs Starting date: 12/01/2020 Today's weight: 224 lbs Today's date: 05/25/2021 Total lbs lost to date: 9 Total lbs lost since last in-office visit: 0  Interim History: Pt reports following the plan very well. She is not over snacking but has been walking less often.  Subjective:   1. Heartburn Pt denies heartburn symptoms currently.  2. Prediabetes She is on Mounjaro 2.5 mg and reports appetite is controlled.  3. At risk for heart disease Valerie Simon is at higher than average risk for cardiovascular disease due to obesity.  Assessment/Plan:   1. Heartburn Continue current treatment plan.  Refill- pantoprazole (PROTONIX) 40 MG tablet; Take 1 tablet (40 mg total) by mouth daily.  Dispense: 30 tablet; Refill: 0  2. Prediabetes Valerie Simon will continue to work on weight loss, exercise, and decreasing simple carbohydrates to help decrease the risk of diabetes. Continue Mounjaro.  3. At risk for heart disease Valerie Simon was given approximately 15 minutes of coronary artery disease prevention counseling today. She is 61 y.o. female and has risk factors for heart disease including obesity. We discussed intensive lifestyle modifications today with an emphasis on specific weight loss instructions and strategies.  Repetitive spaced learning was employed today to elicit superior memory formation and behavioral change.   4. Obesity with current BMI of 36.17 Valerie Simon is currently in the action stage of change. As such, her goal is to continue with weight loss efforts. She has agreed to change to the Category 1 Plan.   Fasting labs at next  visit.  Exercise goals:  As is  Behavioral modification strategies: meal planning and cooking strategies and planning for success.  Valerie Simon has agreed to follow-up with our clinic in 3-4 weeks. She was informed of the importance of frequent follow-up visits to maximize her success with intensive lifestyle modifications for her multiple health conditions.   Objective:   Blood pressure 119/76, pulse 86, temperature 97.9 F (36.6 C), height 5\' 6"  (1.676 m), weight 224 lb (101.6 kg), last menstrual period 10/21/2013, SpO2 99 %. Body mass index is 36.15 kg/m.  General: Cooperative, alert, well developed, in no acute distress. HEENT: Conjunctivae and lids unremarkable. Cardiovascular: Regular rhythm.  Lungs: Normal work of breathing. Neurologic: No focal deficits.   Lab Results  Component Value Date   CREATININE 1.00 12/01/2020   BUN 19 12/01/2020   NA 141 12/01/2020   K 4.4 12/01/2020   CL 102 12/01/2020   CO2 23 12/01/2020   Lab Results  Component Value Date   ALT 15 12/01/2020   AST 22 12/01/2020   ALKPHOS 121 12/01/2020   BILITOT 0.3 12/01/2020   Lab Results  Component Value Date   HGBA1C 6.0 (H) 12/01/2020   Lab Results  Component Value Date   INSULIN 12.2 12/01/2020   Lab Results  Component Value Date   TSH 2.830 12/01/2020   Lab Results  Component Value Date   CHOL 202 (H) 12/01/2020   HDL 70 12/01/2020   LDLCALC 118 (H) 12/01/2020   TRIG 80 12/01/2020   Lab Results  Component Value Date   VD25OH 57.2 12/01/2020   Lab  Results  Component Value Date   WBC 6.4 12/01/2020   HGB 14.6 12/01/2020   HCT 43.3 12/01/2020   MCV 90 12/01/2020   PLT 237 12/01/2020   Attestation Statements:   Reviewed by clinician on day of visit: allergies, medications, problem list, medical history, surgical history, family history, social history, and previous encounter notes.  Coral Ceo, CMA, am acting as transcriptionist for [employer], PA-C.  I have  reviewed the above documentation for accuracy and completeness, and I agree with the above. Abby Potash, PA-C

## 2021-05-31 ENCOUNTER — Encounter: Payer: Self-pay | Admitting: *Deleted

## 2021-06-29 ENCOUNTER — Ambulatory Visit (INDEPENDENT_AMBULATORY_CARE_PROVIDER_SITE_OTHER): Payer: BC Managed Care – PPO | Admitting: Physician Assistant

## 2021-07-06 ENCOUNTER — Encounter: Payer: Self-pay | Admitting: Internal Medicine

## 2021-07-18 IMAGING — CT CT ANGIO HEAD
2 of 8 series · 8 of 33 positions shown · IV contrast (Omnipaque or Isovue)
Comparison: Head CT same day

CLINICAL DATA: Sudden onset of headache.  Possible stroke.

EXAM:
CT ANGIOGRAPHY HEAD AND NECK
TECHNIQUE: Multidetector CT imaging of the head and neck was performed using
the standard protocol during bolus administration of intravenous
contrast. Multiplanar CT image reconstructions and MIPs were
obtained to evaluate the vascular anatomy. Carotid stenosis
measurements (when applicable) are obtained utilizing NASCET
criteria, using the distal internal carotid diameter as the
denominator.
CONTRAST:  75mL OMNIPAQUE IOHEXOL 350 MG/ML SOLN

[Series 6: cta head & neck · axial · 0.43mm/px · z∈[-98,+146]mm · 6 of 684 slices shown]
[im 98/684  soft-tissue]
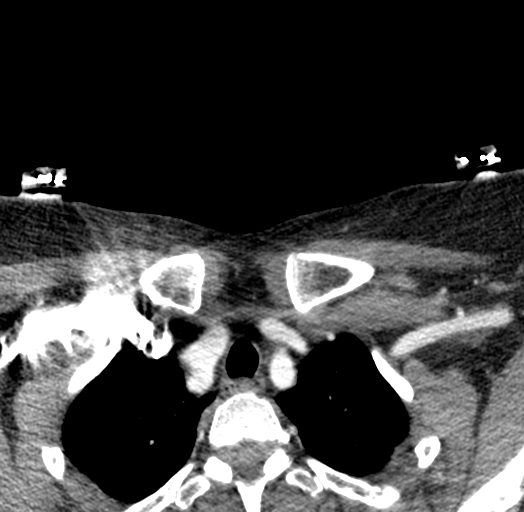
[im 196/684  bone]
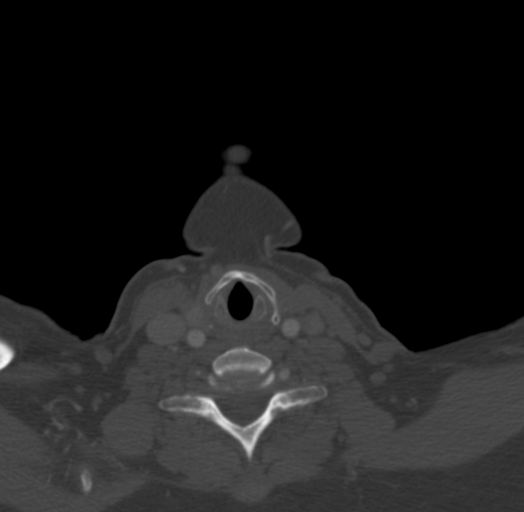
[im 293/684  soft-tissue]
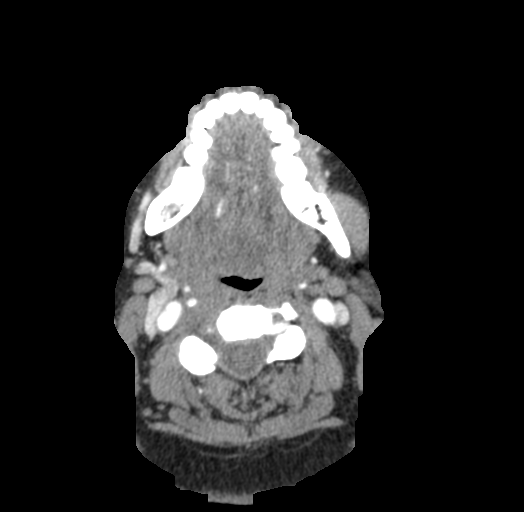
[im 391/684  bone]
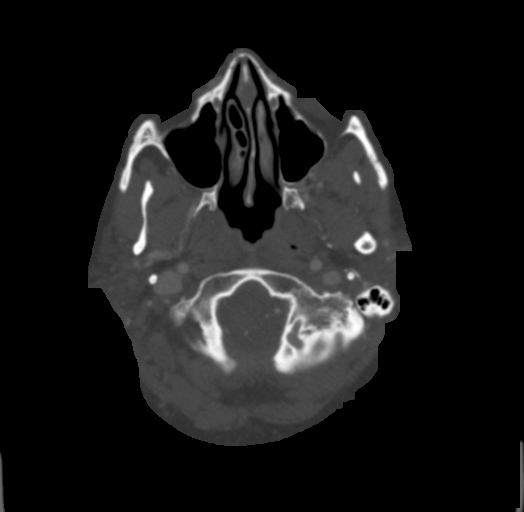
[im 488/684  soft-tissue]
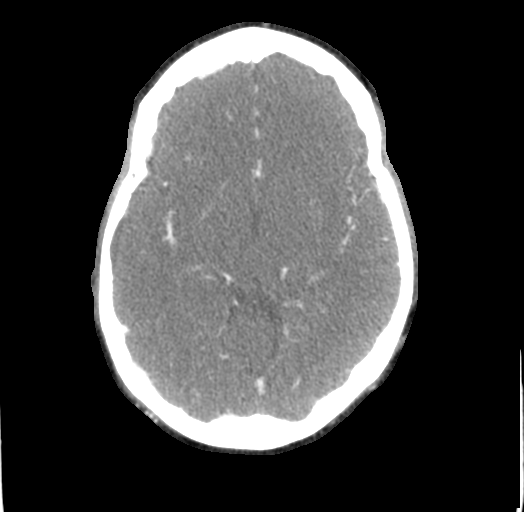
[im 586/684  bone]
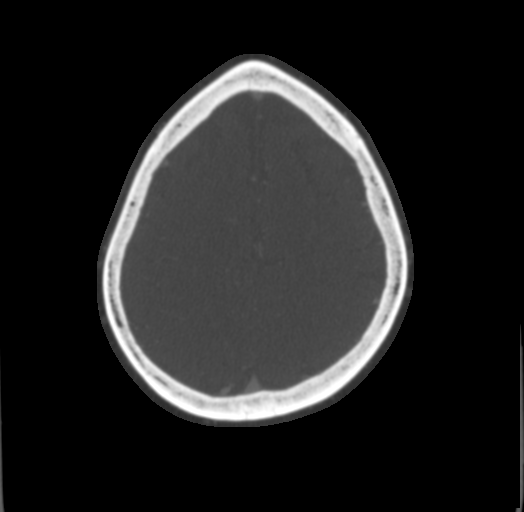

[Series 7: ax thins · axial · 0.38mm/px · z∈[-32,+82]mm · 2 of 342 slices shown]
[im 114/342  soft-tissue]
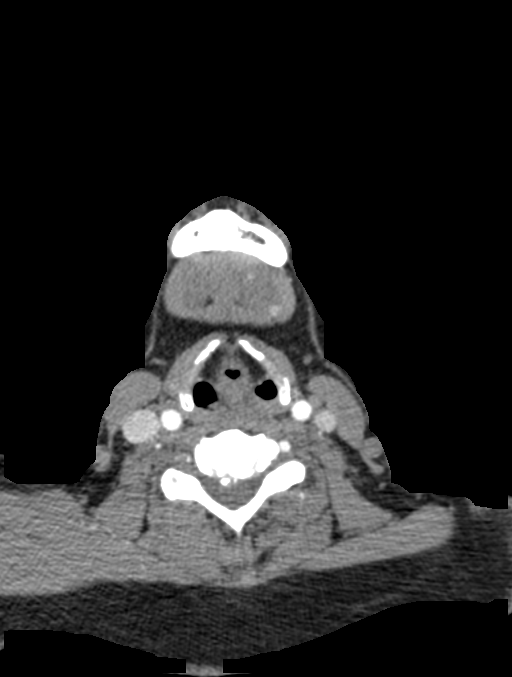
[im 228/342  soft-tissue]
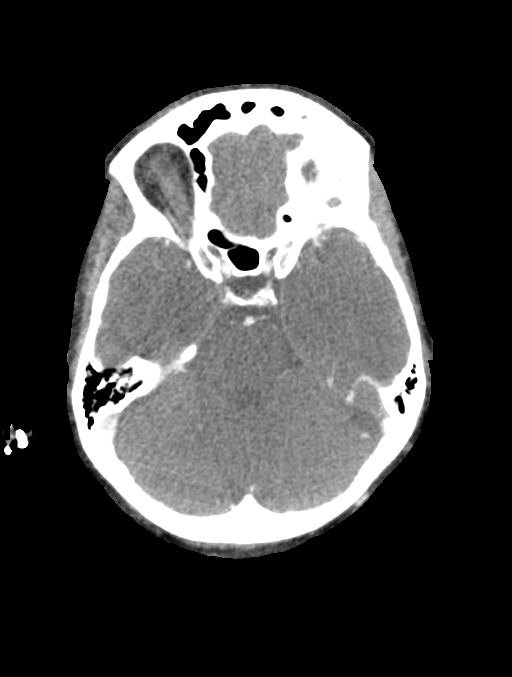

[8 of 33 positions shown; findings below may reference images not displayed]

FINDINGS: CTA NECK FINDINGS

Aortic arch: Normal

Right carotid system: Normal

Left carotid system: Normal

Vertebral arteries: Both vertebral arteries are patent with the left
being dominant in the right being very diminutive.

Skeleton: Ordinary mid cervical spondylosis.

Other neck: No mass or lymphadenopathy.

Upper chest: Negative

Review of the MIP images confirms the above findings

CTA HEAD FINDINGS

Anterior circulation: Both internal carotid arteries widely patent
through the skull base and siphon regions. No siphon atherosclerotic
disease or stenosis. The anterior and middle cerebral vessels are
normal. No occlusion, aneurysm or vascular malformation.

Posterior circulation: Both vertebral arteries widely patent to the
basilar. No basilar stenosis. Posterior circulation branch vessels
are normal. Large posterior communicating arteries.

Venous sinuses: Patent and normal.

Anatomic variants: None

Review of the MIP images confirms the above findings
IMPRESSION: Normal CT angiography of the head and neck. No atherosclerotic
disease. No stenosis. No large or medium vessel occlusion. No
aneurysm or vascular malformation.

## 2021-07-19 ENCOUNTER — Other Ambulatory Visit: Payer: Self-pay

## 2021-07-19 ENCOUNTER — Ambulatory Visit (INDEPENDENT_AMBULATORY_CARE_PROVIDER_SITE_OTHER): Payer: BC Managed Care – PPO | Admitting: Physician Assistant

## 2021-07-19 ENCOUNTER — Encounter (INDEPENDENT_AMBULATORY_CARE_PROVIDER_SITE_OTHER): Payer: Self-pay | Admitting: Physician Assistant

## 2021-07-19 DIAGNOSIS — E669 Obesity, unspecified: Secondary | ICD-10-CM

## 2021-07-19 DIAGNOSIS — E559 Vitamin D deficiency, unspecified: Secondary | ICD-10-CM | POA: Diagnosis not present

## 2021-07-19 DIAGNOSIS — E66812 Obesity, class 2: Secondary | ICD-10-CM

## 2021-07-19 DIAGNOSIS — R12 Heartburn: Secondary | ICD-10-CM | POA: Diagnosis not present

## 2021-07-19 DIAGNOSIS — E7849 Other hyperlipidemia: Secondary | ICD-10-CM

## 2021-07-19 DIAGNOSIS — R7303 Prediabetes: Secondary | ICD-10-CM

## 2021-07-19 DIAGNOSIS — Z6837 Body mass index (BMI) 37.0-37.9, adult: Secondary | ICD-10-CM

## 2021-07-19 DIAGNOSIS — Z9189 Other specified personal risk factors, not elsewhere classified: Secondary | ICD-10-CM

## 2021-07-19 MED ORDER — PANTOPRAZOLE SODIUM 40 MG PO TBEC: 40.0000 mg | DELAYED_RELEASE_TABLET | Freq: Every day | ORAL | 0 refills | Status: AC

## 2021-07-19 NOTE — Progress Notes (Signed)
? ? ? ?Chief Complaint:  ? ?OBESITY ?Valerie Simon is here to discuss her progress with her obesity treatment plan along with follow-up of her obesity related diagnoses. Valerie Simon is on the Category 1 Plan and the Category 2 Plan and states she is following her eating plan approximately 90% of the time. Valerie Simon states she is walking for 60 minutes 3 times per week. ? ?Today's visit was #: 10 ?Starting weight: 233 lbs ?Starting date: 12/01/2020 ?Today's weight: 228 lbs ?Today's date: 07/19/2021 ?Total lbs lost to date: 5 lbs ?Total lbs lost since last in-office visit: 0 ? ?Interim History: Valerie Simon quit taking Mounjaro 3 weeks ago due to reflux symptoms. She continues to have terrible reflux symptoms and will be seeing a gastroenterologist.  ? ?Subjective:  ? ?1. Heartburn/GERD ?Valerie Simon is currently on Protonix. She stats she still has reflux symptoms. She denies blood in stools or back stools.  ? ?2. Prediabetes ?Valerie Simon stopped Mounjaro on own due to reflux symptoms. She states Protonix is not helping with reflux.  ? ?3. Vitamin D deficiency ?Valerie Simon is on Vitamin D 5,000 units daily.  ? ?4. Other hyperlipidemia ?Valerie Simon is not on medications. She is walking 3 times per week.  ? ?5. At risk for diabetes mellitus ?Valerie Simon is at higher than average risk for developing diabetes due to her obesity.  ? ?Assessment/Plan:  ? ?1. Heartburn/GERD ?Intensive lifestyle modifications are the first line treatment for this issue. Valerie Simon will follow up with gastroenterologist in April. We discussed several lifestyle modifications today and she will continue to work on diet, exercise and weight loss efforts. Orders and follow up as documented in patient record.  ? ?Counseling ?If a person has gastroesophageal reflux disease (GERD), food and stomach acid move back up into the esophagus and cause symptoms or problems such as damage to the esophagus. ?Anti-reflux measures include: raising the head of the bed, avoiding tight clothing or belts, avoiding  eating late at night, not lying down shortly after mealtime, and achieving weight loss. ?Avoid ASA, NSAID's, caffeine, alcohol, and tobacco.  ?OTC Pepcid and/or Tums are often very helpful for as needed use.  ?However, for persisting chronic or daily symptoms, stronger medications like Omeprazole may be needed. ?You may need to avoid foods and drinks such as: ?Coffee and tea (with or without caffeine). ?Drinks that contain alcohol. ?Energy drinks and sports drinks. ?Bubbly (carbonated) drinks or sodas. ?Chocolate and cocoa. ?Peppermint and mint flavorings. ?Garlic and onions. ?Horseradish. ?Spicy and acidic foods. These include peppers, chili powder, curry powder, vinegar, hot sauces, and BBQ sauce. ?Citrus fruit juices and citrus fruits, such as oranges, lemons, and limes. ?Tomato-based foods. These include red sauce, chili, salsa, and pizza with red sauce. ?Fried and fatty foods. These include donuts, french fries, potato chips, and high-fat dressings. ?High-fat meats. These include hot dogs, rib eye steak, sausage, ham, and bacon ?.  ? ?2. Prediabetes ?Valerie Simon will stop Mounjaro for now due to reflux symptoms until she sees gastroenterologist. We will check labs today. She will continue to work on weight loss, exercise, and decreasing simple carbohydrates to help decrease the risk of diabetes.  ? ?- Comprehensive metabolic panel ?- Hemoglobin A1c ?- Insulin, random ? ?3. Vitamin D deficiency ?Low Vitamin D level contributes to fatigue and are associated with obesity, breast, and colon cancer. We will check labs toda and Valerie Simon will follow-up for routine testing of Vitamin D, at least 2-3 times per year to avoid over-replacement.  ? ?- VITAMIN D 25 Hydroxy (Vit-D Deficiency,  Fractures) ? ?4. Other hyperlipidemia ?Cardiovascular risk and specific lipid/LDL goals reviewed.  We will check labs today. We discussed several lifestyle modifications today and Valerie Simon will continue to work on diet, exercise and weight loss  efforts. Orders and follow up as documented in patient record.  ? ?Counseling ?Intensive lifestyle modifications are the first line treatment for this issue. ?Dietary changes: Increase soluble fiber. Decrease simple carbohydrates. ?Exercise changes: Moderate to vigorous-intensity aerobic activity 150 minutes per week if tolerated. ?Lipid-lowering medications: see documented in medical record. ?- Lipid panel ? ?5. At risk for diabetes mellitus ?Valerie Simon was given approximately 15 minutes of coronary artery disease prevention counseling today. She is 61 y.o. female and has risk factors for heart disease including obesity. We discussed intensive lifestyle modifications today with an emphasis on specific weight loss instructions and strategies. ? ?Repetitive spaced learning was employed today to elicit superior memory formation and behavioral change.   ? ?6. Obesity with current BMI of 37.14 ?Valerie Simon is currently in the action stage of change. As such, her goal is to continue with weight loss efforts. She has agreed to keeping a food journal and adhering to recommended goals of 1100-1200 calories and 85 grams of protein daily.  ? ?Exercise goals:  As is.  ? ?Behavioral modification strategies: meal planning and cooking strategies and planning for success. ? ?Valerie Simon has agreed to follow-up with our clinic in 2 weeks. She was informed of the importance of frequent follow-up visits to maximize her success with intensive lifestyle modifications for her multiple health conditions.  ? ?Valerie Simon was informed we would discuss her lab results at her next visit unless there is a critical issue that needs to be addressed sooner. Valerie Simon agreed to keep her next visit at the agreed upon time to discuss these results. ? ?Objective:  ? ?Blood pressure (!) 143/88, pulse 85, temperature 97.9 ?F (36.6 ?C), height 5\' 6"  (1.676 m), weight 228 lb (103.4 kg), last menstrual period 10/21/2013, SpO2 98 %. ?Body mass index is 36.8 kg/m?. ? ?General:  Cooperative, alert, well developed, in no acute distress. ?HEENT: Conjunctivae and lids unremarkable. ?Cardiovascular: Regular rhythm.  ?Lungs: Normal work of breathing. ?Neurologic: No focal deficits.  ? ?Lab Results  ?Component Value Date  ? CREATININE 1.00 12/01/2020  ? BUN 19 12/01/2020  ? NA 141 12/01/2020  ? K 4.4 12/01/2020  ? CL 102 12/01/2020  ? CO2 23 12/01/2020  ? ?Lab Results  ?Component Value Date  ? ALT 15 12/01/2020  ? AST 22 12/01/2020  ? ALKPHOS 121 12/01/2020  ? BILITOT 0.3 12/01/2020  ? ?Lab Results  ?Component Value Date  ? HGBA1C 6.0 (H) 12/01/2020  ? ?Lab Results  ?Component Value Date  ? INSULIN 12.2 12/01/2020  ? ?Lab Results  ?Component Value Date  ? TSH 2.830 12/01/2020  ? ?Lab Results  ?Component Value Date  ? CHOL 202 (H) 12/01/2020  ? HDL 70 12/01/2020  ? LDLCALC 118 (H) 12/01/2020  ? TRIG 80 12/01/2020  ? ?Lab Results  ?Component Value Date  ? VD25OH 57.2 12/01/2020  ? ?Lab Results  ?Component Value Date  ? WBC 6.4 12/01/2020  ? HGB 14.6 12/01/2020  ? HCT 43.3 12/01/2020  ? MCV 90 12/01/2020  ? PLT 237 12/01/2020  ? ?No results found for: IRON, TIBC, FERRITIN ? ?Attestation Statements:  ? ?Reviewed by clinician on day of visit: allergies, medications, problem list, medical history, surgical history, family history, social history, and previous encounter notes. ? ?I, 01/31/2021, am acting  as transcriptionist for Alois Clicheracey Ethen Bannan, PA-C. ? ?I have reviewed the above documentation for accuracy and completeness, and I agree with the above. Alois Cliche-  Willie Loy, PA-C ? ?

## 2021-07-20 LAB — COMPREHENSIVE METABOLIC PANEL
ALT: 15 IU/L (ref 0–32)
AST: 18 IU/L (ref 0–40)
Albumin/Globulin Ratio: 1.5 (ref 1.2–2.2)
Albumin: 4.3 g/dL (ref 3.8–4.9)
Alkaline Phosphatase: 108 IU/L (ref 44–121)
BUN/Creatinine Ratio: 17 (ref 9–23)
BUN: 17 mg/dL (ref 6–24)
Bilirubin Total: 0.2 mg/dL (ref 0.0–1.2)
CO2: 24 mmol/L (ref 20–29)
Calcium: 9.9 mg/dL (ref 8.7–10.2)
Chloride: 101 mmol/L (ref 96–106)
Creatinine, Ser: 1 mg/dL (ref 0.57–1.00)
Globulin, Total: 2.9 g/dL (ref 1.5–4.5)
Glucose: 100 mg/dL — ABNORMAL HIGH (ref 70–99)
Potassium: 4.2 mmol/L (ref 3.5–5.2)
Sodium: 139 mmol/L (ref 134–144)
Total Protein: 7.2 g/dL (ref 6.0–8.5)
eGFR: 65 mL/min/{1.73_m2} (ref >59)

## 2021-07-20 LAB — LIPID PANEL
Chol/HDL Ratio: 3.2 ratio (ref 0.0–4.4)
Cholesterol, Total: 210 mg/dL — ABNORMAL HIGH (ref 100–199)
HDL: 65 mg/dL (ref >39)
LDL Chol Calc (NIH): 122 mg/dL — ABNORMAL HIGH (ref 0–99)
Triglycerides: 133 mg/dL (ref 0–149)
VLDL Cholesterol Cal: 23 mg/dL (ref 5–40)

## 2021-07-20 LAB — HEMOGLOBIN A1C
Est. average glucose Bld gHb Est-mCnc: 120 mg/dL
Hgb A1c MFr Bld: 5.8 % — ABNORMAL HIGH (ref 4.8–5.6)

## 2021-07-20 LAB — VITAMIN D 25 HYDROXY (VIT D DEFICIENCY, FRACTURES): Vit D, 25-Hydroxy: 66.3 ng/mL (ref 30.0–100.0)

## 2021-07-20 LAB — INSULIN, RANDOM: INSULIN: 20.4 u[IU]/mL (ref 2.6–24.9)

## 2021-07-27 ENCOUNTER — Ambulatory Visit: Payer: BC Managed Care – PPO

## 2021-07-29 ENCOUNTER — Encounter (INDEPENDENT_AMBULATORY_CARE_PROVIDER_SITE_OTHER): Payer: Self-pay | Admitting: Family Medicine

## 2021-07-29 NOTE — Progress Notes (Signed)
?TeleHealth Visit:  ?Due to the COVID-19 pandemic, this visit was completed with telemedicine (audio/video) technology to reduce patient and provider exposure as well as to preserve personal protective equipment.  ? ?Valerie Simon has verbally consented to this TeleHealth visit. The patient is located at home, the provider is located at home. The participants in this visit include the listed provider and patient. The visit was conducted today via MyChart video. ? ?OBESITY ?Valerie Simon is here to discuss her progress with her obesity treatment plan along with follow-up of her obesity related diagnoses.  ? ?Today's visit was # 11 ?Starting weight: 233 lbs ?Starting date: 12/01/20 ?Total weight loss: 5 lbs at last in office visit. ?Weight at last in office visit: 228 lbs on 07/19/21 ?Today's reported weight: 229 lbs  ?  ?Nutrition Plan: the Category 2 Plan and keeping a food journal and adhering to recommended goals of 400-500 calories and 35 protein at supper. ?Hunger is well controlled. Cravings are moderately controlled.  ?Current exercise: walking 2 days per week for 45 minutes. ? ?Interim History: Valerie Simon reports lack of motivation to adhere to plan.  She feels like she is wasting her time and our time.  She says she knows what to do but has a hard time motivating herself to do it. ?She had been on Mounjaro 2.5 mg which was stopped recently due to reflux.  She did not feel the [facility] helped much with appetite. ? ?Assessment/Plan:  ?Hyperlipidemia ?LDL is not at goal. ?Medication(s):none  ?Cardiovascular risk factors: dyslipidemia, obesity (BMI >= 30 kg/m2), and sedentary lifestyle ? ?Lab Results  ?Component Value Date  ? CHOL 210 (H) 07/19/2021  ? HDL 65 07/19/2021  ? LDLCALC 122 (H) 07/19/2021  ? TRIG 133 07/19/2021  ? CHOLHDL 3.2 07/19/2021  ? ?Lab Results  ?Component Value Date  ? ALT 15 07/19/2021  ? AST 18 07/19/2021  ? ALKPHOS 108 07/19/2021  ? BILITOT 0.2 07/19/2021  ? ?The 10-year ASCVD risk score (Arnett DK,  et al., 2019) is: 4.1% ?  Values used to calculate the score: ?    Age: 61 years ?    Sex: Female ?    Is Non-Hispanic African American: No ?    Diabetic: No ?    Tobacco smoker: No ?    Systolic Blood Pressure: 143 mmHg ?    Is BP treated: Yes ?    HDL Cholesterol: 65 mg/dL ?    Total Cholesterol: 210 mg/dL ? ?Plan: ?Statin not indicated.  10-year ASCVD risk score is 4.1%.   ?Continue to work on reducing saturated fat, exercise, and weight loss ? ?2. Vitamin D Deficiency ?Vitamin D is at goal of 50. She is on over-the-counter vitamin D 5000 international units daily.  Vitamin D level is stable. ?Lab Results  ?Component Value Date  ? VD25OH 66.3 07/19/2021  ? VD25OH 57.2 12/01/2020  ? ? ?Plan: ?Continue OTC vitamin D 5000 international units daily. ? ?Obesity: Current BMI 36.82 ?Valerie Simon is not currently in the action stage of change.  ?She would like to take a break from the program.  I advised her that she may be away from the program for up to 6 months without having to restart and and pay program fee. ? ?She has agreed to practicing portion control and making smarter food choices, such as increasing vegetables and decreasing simple carbohydrates.  ? ?Exercise goals: All adults should avoid inactivity. Some physical activity is better than none, and adults who participate in any amount of physical  activity gain some health benefits. ? ?Behavioral modification strategies: increasing lean protein intake and decreasing simple carbohydrates. ? ?I went over all of her recent lab results with her. ? ?She will call to schedule a follow-up appointment when she is ready. ?I told her that is much as I wanted her to stick with the program I understood if she felt like she needed to take a break. ? ? ?Objective:  ? ?VITALS: Per patient if applicable, see vitals. ?GENERAL: Alert and in no acute distress. ?CARDIOPULMONARY: No increased WOB. Speaking in clear sentences.  ?PSYCH: Pleasant and cooperative. Speech normal rate and  rhythm. Affect is appropriate. Insight and judgement are appropriate. Attention is focused, linear, and appropriate.  ?NEURO: Oriented as arrived to appointment on time with no prompting.  ? ?Lab Results  ?Component Value Date  ? CREATININE 1.00 07/19/2021  ? BUN 17 07/19/2021  ? NA 139 07/19/2021  ? K 4.2 07/19/2021  ? CL 101 07/19/2021  ? CO2 24 07/19/2021  ? ?Lab Results  ?Component Value Date  ? ALT 15 07/19/2021  ? AST 18 07/19/2021  ? ALKPHOS 108 07/19/2021  ? BILITOT 0.2 07/19/2021  ? ?Lab Results  ?Component Value Date  ? HGBA1C 5.8 (H) 07/19/2021  ? HGBA1C 6.0 (H) 12/01/2020  ? ?Lab Results  ?Component Value Date  ? INSULIN 20.4 07/19/2021  ? INSULIN 12.2 12/01/2020  ? ?Lab Results  ?Component Value Date  ? TSH 2.830 12/01/2020  ? ?Lab Results  ?Component Value Date  ? CHOL 210 (H) 07/19/2021  ? HDL 65 07/19/2021  ? LDLCALC 122 (H) 07/19/2021  ? TRIG 133 07/19/2021  ? CHOLHDL 3.2 07/19/2021  ? ?Lab Results  ?Component Value Date  ? WBC 6.4 12/01/2020  ? HGB 14.6 12/01/2020  ? HCT 43.3 12/01/2020  ? MCV 90 12/01/2020  ? PLT 237 12/01/2020  ? ?No results found for: IRON, TIBC, FERRITIN ?Lab Results  ?Component Value Date  ? VD25OH 66.3 07/19/2021  ? VD25OH 57.2 12/01/2020  ? ? ?Attestation Statements:  ? ?Reviewed by clinician on day of visit: allergies, medications, problem list, medical history, surgical history, family history, social history, and previous encounter notes. ? ?Time spent on visit including pre-visit chart review and post-visit charting and care was 31 minutes.  ? ? ?

## 2021-08-02 ENCOUNTER — Encounter (INDEPENDENT_AMBULATORY_CARE_PROVIDER_SITE_OTHER): Payer: Self-pay | Admitting: Family Medicine

## 2021-08-02 ENCOUNTER — Telehealth (INDEPENDENT_AMBULATORY_CARE_PROVIDER_SITE_OTHER): Payer: BC Managed Care – PPO | Admitting: Family Medicine

## 2021-08-02 DIAGNOSIS — E559 Vitamin D deficiency, unspecified: Secondary | ICD-10-CM

## 2021-08-02 DIAGNOSIS — Z6836 Body mass index (BMI) 36.0-36.9, adult: Secondary | ICD-10-CM | POA: Diagnosis not present

## 2021-08-02 DIAGNOSIS — E7849 Other hyperlipidemia: Secondary | ICD-10-CM | POA: Diagnosis not present

## 2021-08-02 DIAGNOSIS — E669 Obesity, unspecified: Secondary | ICD-10-CM

## 2021-08-12 ENCOUNTER — Ambulatory Visit: Payer: BC Managed Care – PPO | Admitting: Internal Medicine

## 2021-08-31 ENCOUNTER — Other Ambulatory Visit (INDEPENDENT_AMBULATORY_CARE_PROVIDER_SITE_OTHER): Payer: Self-pay | Admitting: Physician Assistant

## 2021-08-31 DIAGNOSIS — R12 Heartburn: Secondary | ICD-10-CM

## 2021-09-08 ENCOUNTER — Ambulatory Visit: Payer: BC Managed Care – PPO | Admitting: Internal Medicine

## 2021-11-17 DIAGNOSIS — M25562 Pain in left knee: Secondary | ICD-10-CM | POA: Diagnosis not present

## 2021-11-17 DIAGNOSIS — M25561 Pain in right knee: Secondary | ICD-10-CM | POA: Diagnosis not present

## 2021-12-01 ENCOUNTER — Encounter (INDEPENDENT_AMBULATORY_CARE_PROVIDER_SITE_OTHER): Payer: Self-pay

## 2022-07-13 DIAGNOSIS — J019 Acute sinusitis, unspecified: Secondary | ICD-10-CM | POA: Diagnosis not present

## 2022-08-16 DIAGNOSIS — R030 Elevated blood-pressure reading, without diagnosis of hypertension: Secondary | ICD-10-CM | POA: Diagnosis not present

## 2022-08-24 ENCOUNTER — Other Ambulatory Visit (HOSPITAL_COMMUNITY): Payer: Self-pay | Admitting: Family Medicine

## 2022-08-24 DIAGNOSIS — Z1231 Encounter for screening mammogram for malignant neoplasm of breast: Secondary | ICD-10-CM

## 2022-08-24 DIAGNOSIS — E782 Mixed hyperlipidemia: Secondary | ICD-10-CM | POA: Diagnosis not present

## 2022-08-24 DIAGNOSIS — R7303 Prediabetes: Secondary | ICD-10-CM | POA: Diagnosis not present

## 2022-08-24 DIAGNOSIS — Z6841 Body Mass Index (BMI) 40.0 and over, adult: Secondary | ICD-10-CM | POA: Diagnosis not present

## 2022-08-24 DIAGNOSIS — I10 Essential (primary) hypertension: Secondary | ICD-10-CM | POA: Diagnosis not present

## 2022-08-24 DIAGNOSIS — E669 Obesity, unspecified: Secondary | ICD-10-CM | POA: Diagnosis not present

## 2022-09-05 ENCOUNTER — Encounter (HOSPITAL_COMMUNITY): Payer: Self-pay

## 2022-09-05 ENCOUNTER — Ambulatory Visit (HOSPITAL_COMMUNITY)
Admission: RE | Admit: 2022-09-05 | Discharge: 2022-09-05 | Disposition: A | Discharge status: Home / Self Care | Payer: 59 | Source: Ambulatory Visit | Attending: Family Medicine | Admitting: Family Medicine

## 2022-09-05 DIAGNOSIS — Z1231 Encounter for screening mammogram for malignant neoplasm of breast: Secondary | ICD-10-CM | POA: Diagnosis not present

## 2022-10-10 ENCOUNTER — Encounter: Payer: Self-pay | Admitting: Adult Health

## 2022-10-10 ENCOUNTER — Other Ambulatory Visit (HOSPITAL_COMMUNITY)
Admission: RE | Admit: 2022-10-10 | Discharge: 2022-10-10 | Disposition: A | Discharge status: Home / Self Care | Payer: 59 | Source: Ambulatory Visit | Attending: Adult Health | Admitting: Adult Health

## 2022-10-10 ENCOUNTER — Ambulatory Visit (INDEPENDENT_AMBULATORY_CARE_PROVIDER_SITE_OTHER): Payer: 59 | Admitting: Adult Health

## 2022-10-10 VITALS — BP 134/82 | HR 86 | Ht 65.0 in | Wt 248.0 lb

## 2022-10-10 DIAGNOSIS — Z1212 Encounter for screening for malignant neoplasm of rectum: Secondary | ICD-10-CM

## 2022-10-10 DIAGNOSIS — Z1211 Encounter for screening for malignant neoplasm of colon: Secondary | ICD-10-CM | POA: Diagnosis not present

## 2022-10-10 DIAGNOSIS — Z01419 Encounter for gynecological examination (general) (routine) without abnormal findings: Secondary | ICD-10-CM | POA: Insufficient documentation

## 2022-10-10 LAB — HEMOCCULT GUIAC POC 1CARD (OFFICE): Fecal Occult Blood, POC: NEGATIVE

## 2022-10-10 NOTE — Progress Notes (Signed)
Patient ID: Valerie Simon, female   DOB: 04-Jan-1961, 62 y.o.   MRN: [id number] History of Present Illness: Sawyer is a 62 year old white female, married, PM in for a well woman gyn exam and pap. She is not working, but cares for had mom. Has new grandbaby too.   PCP is Dr Margo Aye.   Current Medications, Allergies, Past Medical History, Past Surgical History, Family History and Social History were reviewed in [employer] record.     Review of Systems: Patient denies any headaches, hearing loss, fatigue, blurred vision, shortness of breath, chest pain, abdominal pain, problems with bowel movements, urination, or intercourse. No mood swings. Has knee pain Denies any vaginal bleeding     Physical Exam:BP 134/82 (BP Location: Left Arm, Patient Position: Sitting, Cuff Size: Large)   Pulse 86   Ht 5\' 5"  (1.651 m)   Wt 248 lb (112.5 kg)   LMP 10/21/2013   BMI 41.27 kg/m   General:  Well developed, well nourished, no acute distress Skin:  Warm and dry Neck:  Midline trachea, normal thyroid, good ROM, no lymphadenopathy Lungs; Clear to auscultation bilaterally Breast:  No dominant palpable mass, retraction, or nipple discharge Cardiovascular: Regular rate and rhythm Abdomen:  Soft, non tender, no hepatosplenomegaly Pelvic:  External genitalia is normal in appearance, no lesions.  The vagina is pale. Urethra has no lesions or masses. The cervix is smooth, pap with HR HPV genotyping performed.  Uterus is felt to be normal size, shape, and contour.  No adnexal masses or tenderness noted.Bladder is non tender, no masses felt. Rectal: Good sphincter tone, no polyps, or hemorrhoids felt.  Hemoccult negative. Extremities/musculoskeletal:  No swelling or varicosities noted, no clubbing or cyanosis Psych:  No mood changes, alert and cooperative,seems happy AA is 0 Fall risk is low    10/10/2022   11:42 AM 12/01/2020    7:55 AM  Depression screen PHQ 2/9  Decreased Interest 0 3   Down, Depressed, Hopeless 0 3  PHQ - 2 Score 0 6  Altered sleeping 0 3  Tired, decreased energy 2 3  Change in appetite 0 1  Feeling bad or failure about yourself  0 3  Trouble concentrating 0 1  Moving slowly or fidgety/restless 0 1  Suicidal thoughts 0 1  PHQ-9 Score 2 19  Difficult doing work/chores  Somewhat difficult       10/10/2022   11:45 AM  GAD 7 : Generalized Anxiety Score  Nervous, Anxious, on Edge 0  Control/stop worrying 0  Worry too much - different things 0  Trouble relaxing 0  Restless 0  Easily annoyed or irritable 0  Afraid - awful might happen 0  Total GAD 7 Score 0      Upstream - 10/10/22 1152       Pregnancy Intention Screening   Does the patient want to become pregnant in the next year? No    Does the patient's partner want to become pregnant in the next year? No    Would the patient like to discuss contraceptive options today? No      Contraception Wrap Up   Current Method No Method - Other Reason   postmenopausal   Reason for No Current Contraceptive Method at Intake ([facility] Only) Other    End Method No Method - Other Reason   postmenopausal   Contraception Counseling Provided No             Examination chaperoned by Malachy Mood LPN  Impression and Plan: 1. Encounter for gynecological examination with Papanicolaou smear of cervix Pap sent Pap in 3 years if normal Physical in 1 year Labs with PCP Mammogram was negative 09/05/22 - Cytology - PAP( [facility])  2. Encounter for screening fecal occult blood testing Hemoccult was negative  - POCT occult blood stool  3. Screening for colorectal cancer Will do cologuard  - Cologuard

## 2022-10-11 LAB — CYTOLOGY - PAP
Comment: NEGATIVE
Diagnosis: NEGATIVE
High risk HPV: NEGATIVE

## 2022-10-18 ENCOUNTER — Ambulatory Visit (HOSPITAL_COMMUNITY): Payer: 59

## 2022-10-24 ENCOUNTER — Encounter (HOSPITAL_COMMUNITY): Payer: Self-pay

## 2022-10-24 ENCOUNTER — Inpatient Hospital Stay (HOSPITAL_COMMUNITY): Admission: RE | Admit: 2022-10-24 | Payer: Self-pay | Source: Ambulatory Visit

## 2022-12-05 ENCOUNTER — Ambulatory Visit (HOSPITAL_COMMUNITY)
Admission: RE | Admit: 2022-12-05 | Discharge: 2022-12-05 | Disposition: A | Discharge status: Home / Self Care | Payer: 59 | Source: Ambulatory Visit | Attending: Family Medicine | Admitting: Family Medicine

## 2022-12-05 DIAGNOSIS — E782 Mixed hyperlipidemia: Secondary | ICD-10-CM | POA: Insufficient documentation

## 2023-11-08 DIAGNOSIS — E66813 Obesity, class 3: Secondary | ICD-10-CM | POA: Diagnosis not present

## 2023-11-08 DIAGNOSIS — R948 Abnormal results of function studies of other organs and systems: Secondary | ICD-10-CM | POA: Diagnosis not present

## 2023-11-08 DIAGNOSIS — I10 Essential (primary) hypertension: Secondary | ICD-10-CM | POA: Diagnosis not present

## 2023-11-08 DIAGNOSIS — Z6841 Body Mass Index (BMI) 40.0 and over, adult: Secondary | ICD-10-CM | POA: Diagnosis not present

## 2023-11-08 DIAGNOSIS — E65 Localized adiposity: Secondary | ICD-10-CM | POA: Diagnosis not present

## 2023-11-22 DIAGNOSIS — R948 Abnormal results of function studies of other organs and systems: Secondary | ICD-10-CM | POA: Diagnosis not present

## 2023-11-22 DIAGNOSIS — E66813 Obesity, class 3: Secondary | ICD-10-CM | POA: Diagnosis not present

## 2023-11-22 DIAGNOSIS — K219 Gastro-esophageal reflux disease without esophagitis: Secondary | ICD-10-CM | POA: Diagnosis not present

## 2023-11-22 DIAGNOSIS — Z6841 Body Mass Index (BMI) 40.0 and over, adult: Secondary | ICD-10-CM | POA: Diagnosis not present

## 2023-11-22 DIAGNOSIS — E65 Localized adiposity: Secondary | ICD-10-CM | POA: Diagnosis not present

## 2023-11-22 DIAGNOSIS — I10 Essential (primary) hypertension: Secondary | ICD-10-CM | POA: Diagnosis not present

## 2023-12-14 DIAGNOSIS — R948 Abnormal results of function studies of other organs and systems: Secondary | ICD-10-CM | POA: Diagnosis not present

## 2023-12-14 DIAGNOSIS — E65 Localized adiposity: Secondary | ICD-10-CM | POA: Diagnosis not present

## 2023-12-14 DIAGNOSIS — K219 Gastro-esophageal reflux disease without esophagitis: Secondary | ICD-10-CM | POA: Diagnosis not present

## 2023-12-14 DIAGNOSIS — Z6841 Body Mass Index (BMI) 40.0 and over, adult: Secondary | ICD-10-CM | POA: Diagnosis not present

## 2023-12-14 DIAGNOSIS — E66813 Obesity, class 3: Secondary | ICD-10-CM | POA: Diagnosis not present

## 2023-12-14 DIAGNOSIS — I10 Essential (primary) hypertension: Secondary | ICD-10-CM | POA: Diagnosis not present

## 2024-02-01 DIAGNOSIS — E65 Localized adiposity: Secondary | ICD-10-CM | POA: Diagnosis not present

## 2024-02-01 DIAGNOSIS — K219 Gastro-esophageal reflux disease without esophagitis: Secondary | ICD-10-CM | POA: Diagnosis not present

## 2024-02-01 DIAGNOSIS — Z9189 Other specified personal risk factors, not elsewhere classified: Secondary | ICD-10-CM | POA: Diagnosis not present

## 2024-02-01 DIAGNOSIS — Z6841 Body Mass Index (BMI) 40.0 and over, adult: Secondary | ICD-10-CM | POA: Diagnosis not present

## 2024-02-01 DIAGNOSIS — E66813 Obesity, class 3: Secondary | ICD-10-CM | POA: Diagnosis not present

## 2024-02-01 DIAGNOSIS — R948 Abnormal results of function studies of other organs and systems: Secondary | ICD-10-CM | POA: Diagnosis not present

## 2024-02-01 DIAGNOSIS — I10 Essential (primary) hypertension: Secondary | ICD-10-CM | POA: Diagnosis not present

## 2024-02-29 DIAGNOSIS — I10 Essential (primary) hypertension: Secondary | ICD-10-CM | POA: Diagnosis not present

## 2024-02-29 DIAGNOSIS — K219 Gastro-esophageal reflux disease without esophagitis: Secondary | ICD-10-CM | POA: Diagnosis not present

## 2024-02-29 DIAGNOSIS — E65 Localized adiposity: Secondary | ICD-10-CM | POA: Diagnosis not present

## 2024-02-29 DIAGNOSIS — Z6841 Body Mass Index (BMI) 40.0 and over, adult: Secondary | ICD-10-CM | POA: Diagnosis not present

## 2024-02-29 DIAGNOSIS — R948 Abnormal results of function studies of other organs and systems: Secondary | ICD-10-CM | POA: Diagnosis not present

## 2024-02-29 DIAGNOSIS — R7303 Prediabetes: Secondary | ICD-10-CM | POA: Diagnosis not present

## 2024-02-29 DIAGNOSIS — E66813 Obesity, class 3: Secondary | ICD-10-CM | POA: Diagnosis not present

## 2024-03-06 DIAGNOSIS — H16041 Marginal corneal ulcer, right eye: Secondary | ICD-10-CM | POA: Diagnosis not present

## 2024-03-07 DIAGNOSIS — H16041 Marginal corneal ulcer, right eye: Secondary | ICD-10-CM | POA: Diagnosis not present

## 2024-03-14 DIAGNOSIS — K219 Gastro-esophageal reflux disease without esophagitis: Secondary | ICD-10-CM | POA: Diagnosis not present

## 2024-03-14 DIAGNOSIS — I10 Essential (primary) hypertension: Secondary | ICD-10-CM | POA: Diagnosis not present

## 2024-03-14 DIAGNOSIS — Z6841 Body Mass Index (BMI) 40.0 and over, adult: Secondary | ICD-10-CM | POA: Diagnosis not present

## 2024-03-14 DIAGNOSIS — R948 Abnormal results of function studies of other organs and systems: Secondary | ICD-10-CM | POA: Diagnosis not present

## 2024-03-14 DIAGNOSIS — E66813 Obesity, class 3: Secondary | ICD-10-CM | POA: Diagnosis not present

## 2024-03-14 DIAGNOSIS — E65 Localized adiposity: Secondary | ICD-10-CM | POA: Diagnosis not present

## 2024-03-14 DIAGNOSIS — R7303 Prediabetes: Secondary | ICD-10-CM | POA: Diagnosis not present

## 2024-03-18 DIAGNOSIS — H16041 Marginal corneal ulcer, right eye: Secondary | ICD-10-CM | POA: Diagnosis not present

## 2024-05-17 ENCOUNTER — Ambulatory Visit: Payer: Self-pay

## 2024-07-10 ENCOUNTER — Ambulatory Visit: Payer: Self-pay
